# Patient Record
Sex: Female | Born: 1960
Health system: Southern US, Community
[De-identification: ages and names within clinical notes are randomized; demographics above are authoritative.]

## PROBLEM LIST (undated history)

## (undated) DIAGNOSIS — K219 Gastro-esophageal reflux disease without esophagitis: Secondary | ICD-10-CM

## (undated) DIAGNOSIS — F32A Depression, unspecified: Secondary | ICD-10-CM

## (undated) DIAGNOSIS — Z923 Personal history of irradiation: Secondary | ICD-10-CM

## (undated) DIAGNOSIS — F419 Anxiety disorder, unspecified: Secondary | ICD-10-CM

## (undated) DIAGNOSIS — B019 Varicella without complication: Secondary | ICD-10-CM

## (undated) HISTORY — DX: Depression, unspecified: F32.A

## (undated) HISTORY — PX: BREAST LUMPECTOMY: SHX2

## (undated) HISTORY — PX: EYE SURGERY: SHX253

## (undated) HISTORY — DX: Anxiety disorder, unspecified: F41.9

## (undated) HISTORY — DX: Varicella without complication: B01.9

---

## 1998-02-24 HISTORY — PX: TUBAL LIGATION: SHX77

## 2004-08-01 ENCOUNTER — Ambulatory Visit: Payer: Self-pay

## 2004-10-18 ENCOUNTER — Ambulatory Visit: Payer: Self-pay | Admitting: Unknown Physician Specialty

## 2005-05-05 ENCOUNTER — Inpatient Hospital Stay (HOSPITAL_COMMUNITY): Admission: RE | Admit: 2005-05-05 | Discharge: 2005-05-09 | Payer: Self-pay | Admitting: Psychiatry

## 2005-05-06 ENCOUNTER — Ambulatory Visit: Payer: Self-pay | Admitting: Psychiatry

## 2005-09-08 ENCOUNTER — Ambulatory Visit: Payer: Self-pay | Admitting: Unknown Physician Specialty

## 2006-09-10 ENCOUNTER — Ambulatory Visit: Payer: Self-pay

## 2008-02-25 HISTORY — PX: BREAST BIOPSY: SHX20

## 2008-08-23 ENCOUNTER — Ambulatory Visit: Payer: Self-pay | Admitting: Unknown Physician Specialty

## 2008-08-24 ENCOUNTER — Ambulatory Visit: Payer: Self-pay | Admitting: Unknown Physician Specialty

## 2009-03-28 ENCOUNTER — Ambulatory Visit: Payer: Self-pay | Admitting: General Surgery

## 2009-11-01 ENCOUNTER — Ambulatory Visit: Payer: Self-pay

## 2009-12-28 ENCOUNTER — Ambulatory Visit: Payer: Self-pay | Admitting: Unknown Physician Specialty

## 2010-01-31 ENCOUNTER — Other Ambulatory Visit: Payer: Self-pay | Admitting: Internal Medicine

## 2011-05-21 ENCOUNTER — Ambulatory Visit: Payer: Self-pay | Admitting: General Practice

## 2012-10-06 ENCOUNTER — Ambulatory Visit: Payer: Self-pay

## 2012-10-15 ENCOUNTER — Ambulatory Visit: Payer: Self-pay

## 2013-01-04 LAB — HM COLONOSCOPY

## 2013-12-21 ENCOUNTER — Ambulatory Visit: Payer: Self-pay

## 2014-05-24 ENCOUNTER — Encounter: Payer: Self-pay | Admitting: *Deleted

## 2014-12-11 ENCOUNTER — Encounter: Payer: Self-pay | Admitting: Physician Assistant

## 2014-12-11 ENCOUNTER — Ambulatory Visit: Payer: Self-pay | Admitting: Physician Assistant

## 2014-12-11 VITALS — BP 118/80 | Temp 98.4°F | Wt 145.0 lb

## 2014-12-11 DIAGNOSIS — H1089 Other conjunctivitis: Secondary | ICD-10-CM

## 2014-12-11 MED ORDER — GENTAMICIN SULFATE 0.3 % OP SOLN: 1.0000 [drp] | Freq: Four times a day (QID) | OPHTHALMIC | Status: DC

## 2014-12-11 MED ORDER — OLOPATADINE HCL 0.1 % OP SOLN: 1.0000 [drp] | Freq: Two times a day (BID) | OPHTHALMIC | Status: DC

## 2014-12-11 NOTE — Progress Notes (Signed)
Eye drainage  Subjective:    Patient ID: Heather Jensen, female    DOB: [birth year], 54 y.o.   MRN: [id number]  HPI Patient complain of 3 week of clear drainage from eye. Two days ago noticed matted eyelid upon awakening.  Denies any vision loss. Took one prednisone pill, strength unkown, and had transit relief.   Review of Systems  Negative except for compliant.    Objective:   Physical Exam PERRL/EOM intact. Erythematous conjunctiva. Dried secretion left eyelid.       Assessment & Plan:  Conjunctivitis. Gentex and Patanol eye drops as driected.  Follow up one week.

## 2015-07-24 ENCOUNTER — Encounter: Payer: Self-pay | Admitting: Physician Assistant

## 2015-07-24 ENCOUNTER — Ambulatory Visit: Payer: Self-pay | Admitting: Physician Assistant

## 2015-07-24 VITALS — BP 124/90 | HR 60 | Temp 98.1°F

## 2015-07-24 DIAGNOSIS — H1033 Unspecified acute conjunctivitis, bilateral: Secondary | ICD-10-CM

## 2015-07-24 MED ORDER — ERYTHROMYCIN 5 MG/GM OP OINT
1.0000 | TOPICAL_OINTMENT | Freq: Three times a day (TID) | OPHTHALMIC | Status: DC
Start: 2015-07-24 — End: 2015-10-17

## 2015-07-24 MED ORDER — TOBRAMYCIN-DEXAMETHASONE 0.3-0.05 % OP SUSP: 2.0000 [drp] | Freq: Four times a day (QID) | OPHTHALMIC | Status: DC

## 2015-07-24 NOTE — Progress Notes (Signed)
S:  C/o both eyes being irritated, matted, sx started 2 d ago, denies, cough, congestion, fever, chills, v/d; remainder ros neg, used old container of great lash mascara, states the last time she had this she had to have drops with steroid and an ointment for her lashes  O: vitals wnl, nad, perrl eomi, both eyes with injected conjunctiva, no drainage or matting noted at this time, neuro intact  A:  Acute conjunctivitis  P: tobradex opth gtts, return if not better in 3 - 5d, if worsening return earlier or see eye doctor, no work until eye is no longer red or draining

## 2015-08-06 DIAGNOSIS — H1089 Other conjunctivitis: Secondary | ICD-10-CM | POA: Diagnosis not present

## 2015-08-10 DIAGNOSIS — H04123 Dry eye syndrome of bilateral lacrimal glands: Secondary | ICD-10-CM | POA: Diagnosis not present

## 2015-08-17 DIAGNOSIS — H04123 Dry eye syndrome of bilateral lacrimal glands: Secondary | ICD-10-CM | POA: Diagnosis not present

## 2015-08-24 DIAGNOSIS — H04123 Dry eye syndrome of bilateral lacrimal glands: Secondary | ICD-10-CM | POA: Diagnosis not present

## 2015-08-29 DIAGNOSIS — H16223 Keratoconjunctivitis sicca, not specified as Sjogren's, bilateral: Secondary | ICD-10-CM | POA: Diagnosis not present

## 2015-09-12 DIAGNOSIS — H16223 Keratoconjunctivitis sicca, not specified as Sjogren's, bilateral: Secondary | ICD-10-CM | POA: Diagnosis not present

## 2015-09-26 ENCOUNTER — Ambulatory Visit: Payer: Self-pay | Admitting: Family Medicine

## 2015-10-04 DIAGNOSIS — H04123 Dry eye syndrome of bilateral lacrimal glands: Secondary | ICD-10-CM | POA: Diagnosis not present

## 2015-10-17 ENCOUNTER — Other Ambulatory Visit (HOSPITAL_COMMUNITY)
Admission: RE | Admit: 2015-10-17 | Discharge: 2015-10-17 | Disposition: A | Discharge status: Home / Self Care | Payer: 59 | Source: Ambulatory Visit | Attending: Internal Medicine | Admitting: Internal Medicine

## 2015-10-17 ENCOUNTER — Encounter: Payer: Self-pay | Admitting: Family Medicine

## 2015-10-17 ENCOUNTER — Ambulatory Visit (INDEPENDENT_AMBULATORY_CARE_PROVIDER_SITE_OTHER): Payer: 59 | Admitting: Family Medicine

## 2015-10-17 VITALS — BP 132/79 | HR 72 | Temp 98.4°F | Ht 66.0 in | Wt 156.0 lb

## 2015-10-17 DIAGNOSIS — Z0000 Encounter for general adult medical examination without abnormal findings: Secondary | ICD-10-CM | POA: Diagnosis not present

## 2015-10-17 DIAGNOSIS — Z01419 Encounter for gynecological examination (general) (routine) without abnormal findings: Secondary | ICD-10-CM | POA: Diagnosis not present

## 2015-10-17 DIAGNOSIS — Z1239 Encounter for other screening for malignant neoplasm of breast: Secondary | ICD-10-CM | POA: Diagnosis not present

## 2015-10-17 DIAGNOSIS — Z124 Encounter for screening for malignant neoplasm of cervix: Secondary | ICD-10-CM | POA: Diagnosis not present

## 2015-10-17 LAB — COMPREHENSIVE METABOLIC PANEL
ALK PHOS: 38 U/L — AB (ref 39–117)
ALT: 19 U/L (ref 0–35)
AST: 22 U/L (ref 0–37)
Albumin: 4.3 g/dL (ref 3.5–5.2)
BILIRUBIN TOTAL: 0.6 mg/dL (ref 0.2–1.2)
BUN: 9 mg/dL (ref 6–23)
CALCIUM: 9.2 mg/dL (ref 8.4–10.5)
CO2: 32 meq/L (ref 19–32)
CREATININE: 0.69 mg/dL (ref 0.40–1.20)
Chloride: 103 mEq/L (ref 96–112)
GFR: 94.07 mL/min (ref >60.00)
GLUCOSE: 88 mg/dL (ref 70–99)
Potassium: 3.7 mEq/L (ref 3.5–5.1)
Sodium: 140 mEq/L (ref 135–145)
TOTAL PROTEIN: 7.1 g/dL (ref 6.0–8.3)

## 2015-10-17 LAB — LIPID PANEL
CHOL/HDL RATIO: 2
Cholesterol: 168 mg/dL (ref 0–200)
HDL: 72.1 mg/dL (ref >39.00)
LDL Cholesterol: 75 mg/dL (ref 0–99)
NONHDL: 95.75
Triglycerides: 104 mg/dL (ref 0.0–149.0)
VLDL: 20.8 mg/dL (ref 0.0–40.0)

## 2015-10-17 LAB — CBC
HEMATOCRIT: 40.8 % (ref 36.0–46.0)
HEMOGLOBIN: 13.8 g/dL (ref 12.0–15.0)
MCHC: 33.7 g/dL (ref 30.0–36.0)
MCV: 90.8 fl (ref 78.0–100.0)
Platelets: 254 10*3/uL (ref 150.0–400.0)
RBC: 4.5 Mil/uL (ref 3.87–5.11)
RDW: 13.1 % (ref 11.5–15.5)
WBC: 6.8 10*3/uL (ref 4.0–10.5)

## 2015-10-17 LAB — TSH: TSH: 0.62 u[IU]/mL (ref 0.35–4.50)

## 2015-10-17 LAB — HEMOGLOBIN A1C: Hgb A1c MFr Bld: 5.3 % (ref 4.6–6.5)

## 2015-10-17 NOTE — Patient Instructions (Signed)
Health Maintenance, Female Adopting a healthy lifestyle and getting preventive care can go a long way to promote health and wellness. Talk with your health care provider about what schedule of regular examinations is right for you. This is a good chance for you to check in with your provider about disease prevention and staying healthy. In between checkups, there are plenty of things you can do on your own. Experts have done a lot of research about which lifestyle changes and preventive measures are most likely to keep you healthy. Ask your health care provider for more information. WEIGHT AND DIET  Eat a healthy diet  Be sure to include plenty of vegetables, fruits, low-fat dairy products, and lean protein.  Do not eat a lot of foods high in solid fats, added sugars, or salt.  Get regular exercise. This is one of the most important things you can do for your health.  Most adults should exercise for at least 150 minutes each week. The exercise should increase your heart rate and make you sweat (moderate-intensity exercise).  Most adults should also do strengthening exercises at least twice a week. This is in addition to the moderate-intensity exercise.  Maintain a healthy weight  Body mass index (BMI) is a measurement that can be used to identify possible weight problems. It estimates body fat based on height and weight. Your health care provider can help determine your BMI and help you achieve or maintain a healthy weight.  For females 20 years of age and older:   A BMI below 18.5 is considered underweight.  A BMI of 18.5 to 24.9 is normal.  A BMI of 25 to 29.9 is considered overweight.  A BMI of 30 and above is considered obese.  Watch levels of cholesterol and blood lipids  You should start having your blood tested for lipids and cholesterol at 54 years of age, then have this test every 5 years.  You may need to have your cholesterol levels checked more often if:  Your lipid  or cholesterol levels are high.  You are older than 54 years of age.  You are at high risk for heart disease.  CANCER SCREENING   Lung Cancer  Lung cancer screening is recommended for adults 55-80 years old who are at high risk for lung cancer because of a history of smoking.  A yearly low-dose CT scan of the lungs is recommended for people who:  Currently smoke.  Have quit within the past 15 years.  Have at least a 30-pack-year history of smoking. A pack year is smoking an average of one pack of cigarettes a day for 1 year.  Yearly screening should continue until it has been 15 years since you quit.  Yearly screening should stop if you develop a health problem that would prevent you from having lung cancer treatment.  Breast Cancer  Practice breast self-awareness. This means understanding how your breasts normally appear and feel.  It also means doing regular breast self-exams. Let your health care provider know about any changes, no matter how small.  If you are in your 20s or 30s, you should have a clinical breast exam (CBE) by a health care provider every 1-3 years as part of a regular health exam.  If you are 40 or older, have a CBE every year. Also consider having a breast X-ray (mammogram) every year.  If you have a family history of breast cancer, talk to your health care provider about genetic screening.  If you   are at high risk for breast cancer, talk to your health care provider about having an MRI and a mammogram every year.  Breast cancer gene (BRCA) assessment is recommended for women who have family members with BRCA-related cancers. BRCA-related cancers include:  Breast.  Ovarian.  Tubal.  Peritoneal cancers.  Results of the assessment will determine the need for genetic counseling and BRCA1 and BRCA2 testing. Cervical Cancer Your health care provider may recommend that you be screened regularly for cancer of the pelvic organs (ovaries, uterus, and  vagina). This screening involves a pelvic examination, including checking for microscopic changes to the surface of your cervix (Pap test). You may be encouraged to have this screening done every 3 years, beginning at age 21.  For women ages 30-65, health care providers may recommend pelvic exams and Pap testing every 3 years, or they may recommend the Pap and pelvic exam, combined with testing for human papilloma virus (HPV), every 5 years. Some types of HPV increase your risk of cervical cancer. Testing for HPV may also be done on women of any age with unclear Pap test results.  Other health care providers may not recommend any screening for nonpregnant women who are considered low risk for pelvic cancer and who do not have symptoms. Ask your health care provider if a screening pelvic exam is right for you.  If you have had past treatment for cervical cancer or a condition that could lead to cancer, you need Pap tests and screening for cancer for at least 20 years after your treatment. If Pap tests have been discontinued, your risk factors (such as having a new sexual partner) need to be reassessed to determine if screening should resume. Some women have medical problems that increase the chance of getting cervical cancer. In these cases, your health care provider may recommend more frequent screening and Pap tests. Colorectal Cancer  This type of cancer can be detected and often prevented.  Routine colorectal cancer screening usually begins at 54 years of age and continues through 54 years of age.  Your health care provider may recommend screening at an earlier age if you have risk factors for colon cancer.  Your health care provider may also recommend using home test kits to check for hidden blood in the stool.  A small camera at the end of a tube can be used to examine your colon directly (sigmoidoscopy or colonoscopy). This is done to check for the earliest forms of colorectal  cancer.  Routine screening usually begins at age 50.  Direct examination of the colon should be repeated every 5-10 years through 54 years of age. However, you may need to be screened more often if early forms of precancerous polyps or small growths are found. Skin Cancer  Check your skin from head to toe regularly.  Tell your health care provider about any new moles or changes in moles, especially if there is a change in a mole's shape or color.  Also tell your health care provider if you have a mole that is larger than the size of a pencil eraser.  Always use sunscreen. Apply sunscreen liberally and repeatedly throughout the day.  Protect yourself by wearing long sleeves, pants, a wide-brimmed hat, and sunglasses whenever you are outside. HEART DISEASE, DIABETES, AND HIGH BLOOD PRESSURE   High blood pressure causes heart disease and increases the risk of stroke. High blood pressure is more likely to develop in:  People who have blood pressure in the high end   of the normal range (130-139/85-89 mm Hg).  People who are overweight or obese.  People who are African American.  If you are 38-23 years of age, have your blood pressure checked every 3-5 years. If you are 61 years of age or older, have your blood pressure checked every year. You should have your blood pressure measured twice--once when you are at a hospital or clinic, and once when you are not at a hospital or clinic. Record the average of the two measurements. To check your blood pressure when you are not at a hospital or clinic, you can use:  An automated blood pressure machine at a pharmacy.  A home blood pressure monitor.  If you are between 45 years and 39 years old, ask your health care provider if you should take aspirin to prevent strokes.  Have regular diabetes screenings. This involves taking a blood sample to check your fasting blood sugar level.  If you are at a normal weight and have a low risk for diabetes,  have this test once every three years after 54 years of age.  If you are overweight and have a high risk for diabetes, consider being tested at a younger age or more often. PREVENTING INFECTION  Hepatitis B  If you have a higher risk for hepatitis B, you should be screened for this virus. You are considered at high risk for hepatitis B if:  You were born in a country where hepatitis B is common. Ask your health care provider which countries are considered high risk.  Your parents were born in a high-risk country, and you have not been immunized against hepatitis B (hepatitis B vaccine).  You have HIV or AIDS.  You use needles to inject street drugs.  You live with someone who has hepatitis B.  You have had sex with someone who has hepatitis B.  You get hemodialysis treatment.  You take certain medicines for conditions, including cancer, organ transplantation, and autoimmune conditions. Hepatitis C  Blood testing is recommended for:  Everyone born from [birth year] through 1964.  Anyone with known risk factors for hepatitis C. Sexually transmitted infections (STIs)  You should be screened for sexually transmitted infections (STIs) including gonorrhea and chlamydia if:  You are sexually active and are younger than 54 years of age.  You are older than 53 years of age and your health care provider tells you that you are at risk for this type of infection.  Your sexual activity has changed since you were last screened and you are at an increased risk for chlamydia or gonorrhea. Ask your health care provider if you are at risk.  If you do not have HIV, but are at risk, it may be recommended that you take a prescription medicine daily to prevent HIV infection. This is called pre-exposure prophylaxis (PrEP). You are considered at risk if:  You are sexually active and do not regularly use condoms or know the HIV status of your partner(s).  You take drugs by injection.  You are sexually  active with a partner who has HIV. Talk with your health care provider about whether you are at high risk of being infected with HIV. If you choose to begin PrEP, you should first be tested for HIV. You should then be tested every 3 months for as long as you are taking PrEP.  PREGNANCY   If you are premenopausal and you may become pregnant, ask your health care provider about preconception counseling.  If you may  become pregnant, take 400 to 800 micrograms (mcg) of folic acid every day.  If you want to prevent pregnancy, talk to your health care provider about birth control (contraception). OSTEOPOROSIS AND MENOPAUSE   Osteoporosis is a disease in which the bones lose minerals and strength with aging. This can result in serious bone fractures. Your risk for osteoporosis can be identified using a bone density scan.  If you are 61 years of age or older, or if you are at risk for osteoporosis and fractures, ask your health care provider if you should be screened.  Ask your health care provider whether you should take a calcium or vitamin D supplement to lower your risk for osteoporosis.  Menopause may have certain physical symptoms and risks.  Hormone replacement therapy may reduce some of these symptoms and risks. Talk to your health care provider about whether hormone replacement therapy is right for you.  HOME CARE INSTRUCTIONS   Schedule regular health, dental, and eye exams.  Stay current with your immunizations.   Do not use any tobacco products including cigarettes, chewing tobacco, or electronic cigarettes.  If you are pregnant, do not drink alcohol.  If you are breastfeeding, limit how much and how often you drink alcohol.  Limit alcohol intake to no more than 1 drink per day for nonpregnant women. One drink equals 12 ounces of beer, 5 ounces of wine, or 1 ounces of hard liquor.  Do not use street drugs.  Do not share needles.  Ask your health care provider for help if  you need support or information about quitting drugs.  Tell your health care provider if you often feel depressed.  Tell your health care provider if you have ever been abused or do not feel safe at home.   This information is not intended to replace advice given to you by your health care provider. Make sure you discuss any questions you have with your health care provider.   Document Released: 08/26/2010 Document Revised: 03/03/2014 Document Reviewed: 01/12/2013 Elsevier Interactive Patient Education [employer].

## 2015-10-17 NOTE — Assessment & Plan Note (Signed)
Pap smear performed today.  Mammogram ordered and scheduled. Colonoscopy up to date.  Tetanus up to date. Flu at work. Breast exam normal.

## 2015-10-17 NOTE — Progress Notes (Signed)
Subjective:  Patient ID: Heather Jensen, female    DOB: 1960-12-17  Age: 55 y.o. MRN: [id number]  CC: Establish care/Annual exam.   HPI Heather Jensen is a 55 y.o. female presents to the clinic today to establish care/Annual exam.  Preventative Healthcare  Pap smear: In need of. Will perform today.  Mammogram: In need of. Ordered today.  Colonoscopy: Up to date.   Immunizations  Tetanus - Up to date.  Pneumococcal - N/A.  Flu - Will receive at work.  Zoster - N/A per guidelines.  Labs: Screening labs today.  Exercise: No regular exercise.  Alcohol use: No.  Smoking/tobacco use: Former smoker.  STD/HIV testing: Declined.  Regular dental exams: Yes.   Wears seat belt: Yes.   PMH, Surgical Hx, Family Hx, Social History reviewed and updated as below.  Past Medical History:  Diagnosis Date  . Chicken pox    Past Surgical History:  Procedure Laterality Date  . BREAST BIOPSY  2010   biopsy of a lump  . CESAREAN SECTION  1989   Family History  Problem Relation Age of Onset  . Hyperlipidemia Mother   . Hypertension Father   . Lung cancer Paternal Grandfather    Social History  Substance Use Topics  . Smoking status: Former Research scientist (life sciences)  . Smokeless tobacco: Never Used  . Alcohol use No   Review of Systems General: Denies unexplained weight loss, fever. Skin: Denies new or changing mole, sore/wound that won't heal. ENT: Trouble hearing, ringing in the ears, sores in the mouth, hoarseness, trouble swallowing. Eyes: Denies trouble seeing/visual disturbance. Heart/CV: Denies chest pain, shortness of breath, edema, palpitations. Lungs/Resp: Denies cough, shortness of breath, hemoptysis. Abd/GI: Denies nausea, vomiting, diarrhea, constipation, abdominal pain, hematochezia, melena. GU: Denies dysuria, incontinence, hematuria, urinary frequency, difficulty starting/keeping stream, vaginal discharge, sexual difficulty, lump in breasts. MSK: Denies  joint pain/swelling, myalgias. Neuro: Denies headaches, weakness, numbness, dizziness, syncope. Psych: Denies sadness, anxiety, stress, memory difficulty. Endocrine: Denies polyuria and polydipsia.  Objective:   Today's Vitals: BP 132/79 (BP Location: Left Arm, Patient Position: Sitting, Cuff Size: Normal)   Pulse 72   Temp 98.4 F (36.9 C) (Oral)   Ht 5' 6" (1.676 m)   Wt 156 lb (70.8 kg)   SpO2 100%   BMI 25.18 kg/m   Physical Exam  Constitutional: She is oriented to person, place, and time. She appears well-developed and well-nourished. No distress.  HENT:  Head: Normocephalic and atraumatic.  Nose: Nose normal.  Mouth/Throat: Oropharynx is clear and moist. No oropharyngeal exudate.  Normal TM's bilaterally.   Eyes: Conjunctivae are normal. No scleral icterus.  Neck: Neck supple. No thyromegaly present.  Cardiovascular: Normal rate and regular rhythm.   No murmur heard. Pulmonary/Chest: Effort normal and breath sounds normal. She has no wheezes. She has no rales.  Breasts: breasts appear normal, no appreciable masses, no skin or nipple changes or axillary nodes.   Abdominal: Soft. She exhibits no distension. There is no tenderness. There is no rebound and no guarding.  Genitourinary:  Genitourinary Comments: Pelvic Exam: External: normal female genitalia without lesions or masses Vagina: normal without lesions or masses Cervix: normal without lesions or masses. Pap smear: performed.   Musculoskeletal: Normal range of motion. She exhibits no edema.  Lymphadenopathy:    She has no cervical adenopathy.  Neurological: She is alert and oriented to person, place, and time.  Skin: Skin is warm and dry. No rash noted.  Psychiatric: She has a normal mood and  affect.  Vitals reviewed.  Assessment & Plan:   Problem List Items Addressed This Visit    Well woman exam with routine gynecological exam - Primary    Pap smear performed today.  Mammogram ordered and  scheduled. Colonoscopy up to date.  Tetanus up to date. Flu at work. Breast exam normal.       Relevant Orders   MM DIGITAL SCREENING BILATERAL   CBC   Comp Met (CMET)   Lipid Profile   HgB A1c   TSH   Cytology - PAP    Other Visit Diagnoses    Screening for breast cancer       Pap smear for cervical cancer screening         Outpatient Encounter Prescriptions as of 10/17/2015  Medication Sig  . [DISCONTINUED] cetirizine (ZYRTEC) 10 MG tablet Take 10 mg by mouth daily. Reported on 07/24/2015  . [DISCONTINUED] erythromycin ophthalmic ointment Place 1 application into both eyes 3 (three) times daily.  . [DISCONTINUED] gentamicin (GARAMYCIN) 0.3 % ophthalmic solution Place 1 drop into both eyes 4 (four) times daily. (Patient not taking: Reported on 07/24/2015)  . [DISCONTINUED] olopatadine (PATANOL) 0.1 % ophthalmic solution Place 1 drop into the left eye 2 (two) times daily. (Patient not taking: Reported on 07/24/2015)  . [DISCONTINUED] Tobramycin-Dexamethasone 0.3-0.05 % SUSP Apply 2 drops to eye QID.   No facility-administered encounter medications on file as of 10/17/2015.    Follow-up: Annually.   [place]

## 2015-10-18 LAB — CYTOLOGY - PAP

## 2015-10-19 ENCOUNTER — Telehealth: Payer: Self-pay

## 2015-10-19 NOTE — Telephone Encounter (Signed)
-----   Message from Dia Crawford, LPN sent at [id number] 10:47 AM EDT -----   ----- Message ----- From: Coral Spikes, DO Sent: 10/19/2015   7:23 AM To: Carmin Muskrat, CMA  Normal pap.

## 2015-10-19 NOTE — Telephone Encounter (Signed)
Sent mychart message of normal pap results.

## 2015-10-19 NOTE — Telephone Encounter (Signed)
LMTCB

## 2015-10-22 ENCOUNTER — Encounter: Payer: Self-pay | Admitting: Family Medicine

## 2015-10-24 ENCOUNTER — Other Ambulatory Visit: Payer: Self-pay | Admitting: Family Medicine

## 2015-10-24 ENCOUNTER — Ambulatory Visit
Admission: RE | Admit: 2015-10-24 | Discharge: 2015-10-24 | Disposition: A | Discharge status: Home / Self Care | Payer: 59 | Source: Ambulatory Visit | Attending: Family Medicine | Admitting: Family Medicine

## 2015-10-24 DIAGNOSIS — Z01419 Encounter for gynecological examination (general) (routine) without abnormal findings: Secondary | ICD-10-CM

## 2015-10-24 DIAGNOSIS — Z1231 Encounter for screening mammogram for malignant neoplasm of breast: Secondary | ICD-10-CM | POA: Insufficient documentation

## 2015-12-21 ENCOUNTER — Ambulatory Visit (INDEPENDENT_AMBULATORY_CARE_PROVIDER_SITE_OTHER): Payer: 59 | Admitting: Family Medicine

## 2015-12-21 ENCOUNTER — Encounter: Payer: Self-pay | Admitting: Family Medicine

## 2015-12-21 DIAGNOSIS — R21 Rash and other nonspecific skin eruption: Secondary | ICD-10-CM | POA: Diagnosis not present

## 2015-12-21 MED ORDER — METRONIDAZOLE 1 % EX CREA: TOPICAL_CREAM | Freq: Every day | CUTANEOUS | 0 refills | Status: DC

## 2015-12-21 NOTE — Addendum Note (Signed)
Addended by: Coral Spikes on: 12/21/2015 09:00 AM   Modules accepted: Orders

## 2015-12-21 NOTE — Addendum Note (Signed)
Addended by: Coral Spikes on: 12/21/2015 09:01 AM   Modules accepted: Orders

## 2015-12-21 NOTE — Progress Notes (Addendum)
   Subjective:  Patient ID: Heather Jensen, female    DOB: Mar 13, 1960  Age: 55 y.o. MRN: [id number]  CC: Rash  HPI:  55 year old female presents with complaints of rash.  Rash  X 2 weeks.  Located primarily around the chin/lower jaw line.  Does affect the cheeks as well.  She reports dryness and irritation. She's been using Vaseline with some improvement.  She states that it is intermittent in comes and goes.  She reports that she has an ongoing issue with dry eyes as well.  She is concerned that this may be related.  No new changes or exposures.  No known exacerbating factors. No other complaints at this time.  Social Hx   Social History   Social History  . Marital status: Married    Spouse name: N/A  . Number of children: N/A  . Years of education: N/A   Social History Main Topics  . Smoking status: Former Research scientist (life sciences)  . Smokeless tobacco: Never Used  . Alcohol use No  . Drug use: Unknown  . Sexual activity: Yes    Partners: Male   Other Topics Concern  . None   Social History Narrative  . None   Review of Systems  Constitutional: Negative.   Skin: Positive for rash.   Objective:  BP 130/82 (BP Location: Right Arm, Patient Position: Sitting, Cuff Size: Normal)   Pulse 65   Temp 98.1 F (36.7 C) (Oral)   Wt 164 lb 2 oz (74.4 kg)   SpO2 94%   BMI 26.49 kg/m   BP/Weight 12/21/2015 10/17/2015 [id number]  Systolic BP [id number]  Diastolic BP 82 79 90  Wt. (Lbs) 164.13 156 -  BMI 26.49 25.18 -   Physical Exam  Constitutional: She is oriented to person, place, and time. She appears well-developed. No distress.  Pulmonary/Chest: Effort normal.  Neurological: She is alert and oriented to person, place, and time.  Skin:  Face - mild erythema and dryness noted around the chin. Mild swelling.  Psychiatric: She has a normal mood and affect.  Vitals reviewed.  Lab Results  Component Value Date   WBC 6.8 10/17/2015   HGB 13.8 10/17/2015   HCT  40.8 10/17/2015   PLT 254.0 10/17/2015   GLUCOSE 88 10/17/2015   CHOL 168 10/17/2015   TRIG 104.0 10/17/2015   HDL 72.10 10/17/2015   LDLCALC 75 10/17/2015   ALT 19 10/17/2015   AST 22 10/17/2015   NA 140 10/17/2015   K 3.7 10/17/2015   CL 103 10/17/2015   CREATININE 0.69 10/17/2015   BUN 9 10/17/2015   CO2 32 10/17/2015   TSH 0.62 10/17/2015   HGBA1C 5.3 10/17/2015    Assessment & Plan:   Problem List Items Addressed This Visit    Rash    New problem. Unclear etiology/prognosis. DDx - dry skin, rosacea, perioral dermatitis. Trial of Metronidazole.      Relevant Orders   Ambulatory referral to Dermatology    Other Visit Diagnoses   None.    Meds ordered this encounter  Medications  . metronidazole (NORITATE) 1 % cream    Sig: Apply topically daily.    Dispense:  60 g    Refill:  0   Follow-up: PRN  [place]

## 2015-12-21 NOTE — Progress Notes (Signed)
Pre visit review using our clinic review tool, if applicable. No additional management support is needed unless otherwise documented below in the visit note. 

## 2015-12-21 NOTE — Patient Instructions (Signed)
Try Cerave.  Start the topical Metronidazole.  We will arrange the referral.  Take care  Dr. Lacinda Axon

## 2015-12-21 NOTE — Assessment & Plan Note (Signed)
New problem. Unclear etiology/prognosis. DDx - dry skin, rosacea, perioral dermatitis. Trial of Metronidazole.

## 2016-01-02 DIAGNOSIS — H01001 Unspecified blepharitis right upper eyelid: Secondary | ICD-10-CM | POA: Diagnosis not present

## 2016-02-05 DIAGNOSIS — H01001 Unspecified blepharitis right upper eyelid: Secondary | ICD-10-CM | POA: Diagnosis not present

## 2016-03-18 DIAGNOSIS — H16223 Keratoconjunctivitis sicca, not specified as Sjogren's, bilateral: Secondary | ICD-10-CM | POA: Diagnosis not present

## 2017-01-28 DIAGNOSIS — H524 Presbyopia: Secondary | ICD-10-CM | POA: Diagnosis not present

## 2017-02-24 DIAGNOSIS — D369 Benign neoplasm, unspecified site: Secondary | ICD-10-CM

## 2017-02-24 HISTORY — PX: COLONOSCOPY: SHX174

## 2017-02-24 HISTORY — DX: Benign neoplasm, unspecified site: D36.9

## 2017-04-10 ENCOUNTER — Encounter: Payer: Self-pay | Admitting: Nurse Practitioner

## 2017-04-10 ENCOUNTER — Ambulatory Visit (INDEPENDENT_AMBULATORY_CARE_PROVIDER_SITE_OTHER): Payer: 59 | Admitting: Nurse Practitioner

## 2017-04-10 ENCOUNTER — Other Ambulatory Visit: Payer: Self-pay

## 2017-04-10 VITALS — BP 136/73 | HR 60 | Temp 98.5°F | Ht 66.0 in | Wt 176.6 lb

## 2017-04-10 DIAGNOSIS — E663 Overweight: Secondary | ICD-10-CM | POA: Diagnosis not present

## 2017-04-10 DIAGNOSIS — Z7689 Persons encountering health services in other specified circumstances: Secondary | ICD-10-CM | POA: Diagnosis not present

## 2017-04-10 DIAGNOSIS — K5901 Slow transit constipation: Secondary | ICD-10-CM

## 2017-04-10 NOTE — Assessment & Plan Note (Signed)
Pt with goal of BMI = 25.  She is already 13 lbs toward goal since January 2.  Using Weight [employer] and has reached a plateau.  Not always consuming recommended point value daily.  Plan: 1. Continue Weight Watchers and consume recommended point value.  Consider adding some moderate glycemic fruits and vegetables. 2. Can then reduce point value again to prompt continued weight loss. 3. Followup as needed and in 3 months with annual physical.

## 2017-04-10 NOTE — Assessment & Plan Note (Addendum)
Pt reports regular pattern of constipation.  She notes occasional length of time between BM of 5 days with [facility] stool Type 1-2.  Normally [place] stool type 3-4, but most often type 3.  Increased fiber and regular 64 oz water has not helped.  Plan: 1. Encouraged pt to increase water to 72-84 oz water daily. 2. May use colace twice daily if no BM in 3 days.  Add dulcolax x 1 tablet if no BM in 5 days. 3. Can consider linzess or amitiza in future if needed.  4. Followup 3 months for annual physical and as needed.

## 2017-04-10 NOTE — Progress Notes (Signed)
Subjective:    Patient ID: Heather Jensen, female    DOB: 1960-07-08, 57 y.o.   MRN: [id number]  Heather Jensen is a 57 y.o. female presenting on 04/10/2017 for Establish Care (transferring care from [facility] DR. Lacinda Axon )   HPI [place] Provider Pt last seen by PCP Dr. Lacinda Axon about 2 years ago.  Obtain records from [facility].   Last PAP: 2017, Mammogram: 2017, Colonoscopy 2014 (Dr. Vira Agar) polyps?5 year follow-up. - Pt states she will call for followup interval.  Overweight - active Weight Watcher's plan for loss. Weightloss since January of 13 lbs.  Pt has goal of total loss of 25 lbs.   - Pt is almost always under 23 points daily w/ WW Tends to follow low dairy, lots of lean meat/green vegetables.  Rarely bread/refined carbs. -- Breakfast: yogurt, egg -- Lunch: lean meat, salad/green veg,  -- Dinner: lean meat, salad/geen vegs.  Red meat one x per week.  Constipation Pt reports she is regularly constipated.  Has family history of slow-transit constipation.  Usually will have at least 3 days between [facility], but will occasionally be 5 days.  At that point, BM is very hard and requires straining to defecate, pt is significantly bloated. - Denies abdominal pain, nausea, vomiting, diarrhea, hemorrhoids, bloody stools, ribbon-like stools/other shape change. - Has tried Miralax once daily, then it became ineffective after several months-years of use.    - Has also used colace without significant change in bowel habits. - Notes has increased fiber in diet and expected that to help. - Drinking at least 64 oz daily.  X 6 weeks.   Past Medical History:  Diagnosis Date  . Chicken pox    Past Surgical History:  Procedure Laterality Date  . BREAST BIOPSY Left 2010   biopsy of a lump  . CESAREAN SECTION  1989  . TUBAL LIGATION  2000   Social History   Socioeconomic History  . Marital status: Married    Spouse name: Not on file  . Number of children: Not on file   . Years of education: Not on file  . Highest education level: Bachelor's degree (e.g., BA, AB, BS)  Social Needs  . Financial resource strain: Not hard at all  . Food insecurity - worry: Never true  . Food insecurity - inability: Never true  . Transportation needs - medical: No  . Transportation needs - non-medical: No  Occupational History  . Not on file  Tobacco Use  . Smoking status: Former Smoker    Packs/day: 0.50    Years: 30.00    Pack years: 15.00    Last attempt to quit: 04/10/2014    Years since quitting: 3.0  . Smokeless tobacco: Never Used  Substance and Sexual Activity  . Alcohol use: No    Alcohol/week: 0.0 oz  . Drug use: No  . Sexual activity: Yes    Partners: Male    Birth control/protection: None  Other Topics Concern  . Not on file  Social History Narrative  . Not on file   Family History  Problem Relation Age of Onset  . Hyperlipidemia Mother   . Alzheimer's disease Mother   . Hypertension Father   . Heart disease Father   . Lung cancer Paternal Grandfather   . Melanoma Brother   . Breast cancer Neg Hx   . Stroke Neg Hx    No current outpatient medications on file prior to visit.   No current facility-administered  medications on file prior to visit.     Review of Systems  Constitutional: Negative.   HENT: Negative.   Eyes: Negative.   Respiratory: Negative.   Cardiovascular: Negative.   Gastrointestinal: Positive for constipation.  Endocrine: Negative.   Genitourinary: Negative.   Musculoskeletal: Negative.   Skin: Negative.   Allergic/Immunologic: Negative.   Neurological: Negative.   Hematological: Negative.   Psychiatric/Behavioral: Negative.    Per HPI unless specifically indicated above      Objective:    BP 136/73 (BP Location: Right Arm, Patient Position: Sitting, Cuff Size: Normal)   Pulse 60   Temp 98.5 F (36.9 C) (Oral)   Ht '5\' 6"'$  (1.676 m)   Wt 176 lb 9.6 oz (80.1 kg)   BMI 28.50 kg/m   Wt Readings from Last  3 Encounters:  04/10/17 176 lb 9.6 oz (80.1 kg)  12/21/15 164 lb 2 oz (74.4 kg)  10/17/15 156 lb (70.8 kg)    Physical Exam  General - overweight, well-appearing, NAD HEENT - Normocephalic, atraumatic, PERRL, EOMI, patent nares w/o congestion, oropharynx clear, MMM Neck - supple, non-tender, no LAD, no thyromegaly, no carotid bruit Heart - RRR, no murmurs heard Lungs - Clear throughout all lobes, no wheezing, crackles, or rhonchi. Normal work of breathing. Abdomen - soft, NTND, no masses, no hepatosplenomegaly, active bowel sounds Extremeties - non-tender, no edema, cap refill < 2 seconds, peripheral pulses intact +2 bilaterally Skin - warm, dry, no rashes Neuro - awake, alert, oriented x3, normal gait Psych - Normal mood and affect, normal behavior   Results for orders placed or performed in visit on 10/17/15  CBC  Result Value Ref Range   WBC 6.8 4.0 - 10.5 K/uL   RBC 4.50 3.87 - 5.11 Mil/uL   Platelets 254.0 150.0 - 400.0 K/uL   Hemoglobin 13.8 12.0 - 15.0 g/dL   HCT 40.8 36.0 - 46.0 %   MCV 90.8 78.0 - 100.0 fl   MCHC 33.7 30.0 - 36.0 g/dL   RDW 13.1 11.5 - 15.5 %  Comp Met (CMET)  Result Value Ref Range   Sodium 140 135 - 145 mEq/L   Potassium 3.7 3.5 - 5.1 mEq/L   Chloride 103 96 - 112 mEq/L   CO2 32 19 - 32 mEq/L   Glucose, Bld 88 70 - 99 mg/dL   BUN 9 6 - 23 mg/dL   Creatinine, Ser 0.69 0.40 - 1.20 mg/dL   Total Bilirubin 0.6 0.2 - 1.2 mg/dL   Alkaline Phosphatase 38 (L) 39 - 117 U/L   AST 22 0 - 37 U/L   ALT 19 0 - 35 U/L   Total Protein 7.1 6.0 - 8.3 g/dL   Albumin 4.3 3.5 - 5.2 g/dL   Calcium 9.2 8.4 - 10.5 mg/dL   GFR 94.07 >60.00 mL/min  Lipid Profile  Result Value Ref Range   Cholesterol 168 0 - 200 mg/dL   Triglycerides 104.0 0.0 - 149.0 mg/dL   HDL 72.10 >39.00 mg/dL   VLDL 20.8 0.0 - 40.0 mg/dL   LDL Cholesterol 75 0 - 99 mg/dL   Total CHOL/HDL Ratio 2    NonHDL 95.75   HgB A1c  Result Value Ref Range   Hgb A1c MFr Bld 5.3 4.6 - 6.5 %  TSH    Result Value Ref Range   TSH 0.62 0.35 - 4.50 uIU/mL  Cytology - PAP  Result Value Ref Range   CYTOLOGY - PAP PAP RESULT  Assessment & Plan:   Problem List Items Addressed This Visit      Digestive   Constipation due to slow transit    Pt reports regular pattern of constipation.  She notes occasional length of time between BM of 5 days with [facility] stool Type 1-2.  Normally [place] stool type 3-4, but most often type 3.  Increased fiber and regular 64 oz water has not helped.  Plan: 1. Encouraged pt to increase water to 72-84 oz water daily. 2. May use colace twice daily if no BM in 3 days.  Add dulcolax x 1 tablet if no BM in 5 days. 3. Can consider linzess or amitiza in future if needed.  4. Followup 3 months for annual physical and as needed.        Other   Overweight (BMI 25.0-29.9)    Pt with goal of BMI = 25.  She is already 13 lbs toward goal since January 2.  Using Weight Watchers and has reached a plateau.  Not always consuming recommended point value daily.  Plan: 1. Continue Weight Watchers and consume recommended point value.  Consider adding some moderate glycemic fruits and vegetables. 2. Can then reduce point value again to prompt continued weight loss. 3. Followup as needed and in 3 months with annual physical.       Other Visit Diagnoses    Encounter to establish care    -  Primary Previous PCP was at Dr. Lacinda Axon.  Records are reivewed in [facility].  Past medical, family, and surgical history reviewed w/ pt in clinic today.        Follow up plan: Return in about 3 months (around 07/08/2017) for annual physical.  Cassell Smiles, DNP, AGPCNP-BC Adult Gerontology Primary Care Nurse Practitioner [facility] 04/10/2017, 5:29 PM

## 2017-04-10 NOTE — Patient Instructions (Addendum)
Be[place], Thank you for coming in to clinic today.  1. Work on your weight loss goals.  You have good short-term goal.  You may need to increase your intake for a few weeks then reduce to your current level for additional weight loss with a plateau.  2. Constipation: Increase water intake to about 72-84 oz per day. Start colace daily as needed for constipation > 3 days.  Use dulcolax sparingly, but if constipation > 5 days.  Please schedule a follow-up appointment with Cassell Smiles, AGNP. Return in about 3 months (around 07/08/2017) for annual physical.  If you have any other questions or concerns, please feel free to call the clinic or send a message through [place]. You may also schedule an earlier appointment if necessary.  You will receive a survey after today's visit either digitally by e-mail or paper by [employer]. Your experiences and feedback matter to [place].  Please respond so we know how we are doing as we provide care for you.   Cassell Smiles, DNP, AGNP-BC Adult Gerontology Nurse Practitioner [place]

## 2017-07-03 ENCOUNTER — Other Ambulatory Visit: Payer: Self-pay | Admitting: Nurse Practitioner

## 2017-07-03 ENCOUNTER — Other Ambulatory Visit: Payer: 59

## 2017-07-03 DIAGNOSIS — Z0000 Encounter for general adult medical examination without abnormal findings: Secondary | ICD-10-CM

## 2017-07-05 LAB — LIPID PANEL
Cholesterol: 170 mg/dL (ref <200)
HDL: 66 mg/dL (ref >50)
LDL Cholesterol (Calc): 90 mg/dL (calc)
Non-HDL Cholesterol (Calc): 104 mg/dL (calc) (ref <130)
Total CHOL/HDL Ratio: 2.6 (calc) (ref <5.0)
Triglycerides: 55 mg/dL (ref <150)

## 2017-07-05 LAB — COMPLETE METABOLIC PANEL WITH GFR
AG Ratio: 1.8 (calc) (ref 1.0–2.5)
ALT: 11 U/L (ref 6–29)
AST: 17 U/L (ref 10–35)
Albumin: 4.3 g/dL (ref 3.6–5.1)
Alkaline phosphatase (APISO): 48 U/L (ref 33–130)
BUN: 17 mg/dL (ref 7–25)
CO2: 25 mmol/L (ref 20–32)
Calcium: 9.4 mg/dL (ref 8.6–10.4)
Chloride: 101 mmol/L (ref 98–110)
Creat: 0.8 mg/dL (ref 0.50–1.05)
GFR, Est African American: 96 mL/min/{1.73_m2} (ref >=60)
GFR, Est Non African American: 82 mL/min/{1.73_m2} (ref >=60)
Globulin: 2.4 g/dL (calc) (ref 1.9–3.7)
Glucose, Bld: 79 mg/dL (ref 65–99)
Potassium: 4.1 mmol/L (ref 3.5–5.3)
Sodium: 138 mmol/L (ref 135–146)
Total Bilirubin: 0.8 mg/dL (ref 0.2–1.2)
Total Protein: 6.7 g/dL (ref 6.1–8.1)

## 2017-07-05 LAB — CBC WITH DIFFERENTIAL/PLATELET
Basophils Absolute: 30 cells/uL (ref 0–200)
Basophils Relative: 0.5 %
Eosinophils Absolute: 120 cells/uL (ref 15–500)
Eosinophils Relative: 2 %
HCT: 40 % (ref 35.0–45.0)
Hemoglobin: 13.7 g/dL (ref 11.7–15.5)
Lymphs Abs: 1722 cells/uL (ref 850–3900)
MCH: 30.6 pg (ref 27.0–33.0)
MCHC: 34.3 g/dL (ref 32.0–36.0)
MCV: 89.5 fL (ref 80.0–100.0)
MPV: 11 fL (ref 7.5–12.5)
Monocytes Relative: 10.3 %
Neutro Abs: 3510 cells/uL (ref 1500–7800)
Neutrophils Relative %: 58.5 %
Platelets: 251 10*3/uL (ref 140–400)
RBC: 4.47 10*6/uL (ref 3.80–5.10)
RDW: 12.3 % (ref 11.0–15.0)
Total Lymphocyte: 28.7 %
WBC mixed population: 618 cells/uL (ref 200–950)
WBC: 6 10*3/uL (ref 3.8–10.8)

## 2017-07-05 LAB — HIV ANTIBODY (ROUTINE TESTING W REFLEX): HIV 1&2 Ab, 4th Generation: NONREACTIVE

## 2017-07-05 LAB — HEMOGLOBIN A1C
Hgb A1c MFr Bld: 5.1 % of total Hgb (ref <5.7)
Mean Plasma Glucose: 100 (calc)
eAG (mmol/L): 5.5 (calc)

## 2017-07-05 LAB — HEPATITIS C ANTIBODY
Hepatitis C Ab: NONREACTIVE
SIGNAL TO CUT-OFF: 0 (ref <1.00)

## 2017-07-05 LAB — TSH: TSH: 1.14 mIU/L (ref 0.40–4.50)

## 2017-07-10 ENCOUNTER — Encounter: Payer: 59 | Admitting: Nurse Practitioner

## 2017-07-13 ENCOUNTER — Other Ambulatory Visit: Payer: Self-pay

## 2017-07-13 ENCOUNTER — Ambulatory Visit (INDEPENDENT_AMBULATORY_CARE_PROVIDER_SITE_OTHER): Payer: 59 | Admitting: Nurse Practitioner

## 2017-07-13 ENCOUNTER — Encounter: Payer: Self-pay | Admitting: Nurse Practitioner

## 2017-07-13 VITALS — BP 123/67 | HR 59 | Temp 98.3°F | Ht 66.0 in | Wt 180.2 lb

## 2017-07-13 DIAGNOSIS — Z1239 Encounter for other screening for malignant neoplasm of breast: Secondary | ICD-10-CM

## 2017-07-13 DIAGNOSIS — N951 Menopausal and female climacteric states: Secondary | ICD-10-CM | POA: Diagnosis not present

## 2017-07-13 DIAGNOSIS — Z0000 Encounter for general adult medical examination without abnormal findings: Secondary | ICD-10-CM

## 2017-07-13 DIAGNOSIS — Z13820 Encounter for screening for osteoporosis: Secondary | ICD-10-CM

## 2017-07-13 DIAGNOSIS — Z1231 Encounter for screening mammogram for malignant neoplasm of breast: Secondary | ICD-10-CM

## 2017-07-13 DIAGNOSIS — E663 Overweight: Secondary | ICD-10-CM | POA: Diagnosis not present

## 2017-07-13 DIAGNOSIS — Z124 Encounter for screening for malignant neoplasm of cervix: Secondary | ICD-10-CM | POA: Diagnosis not present

## 2017-07-13 MED ORDER — PHENTERMINE HCL 15 MG PO CAPS: 15.0000 mg | ORAL_CAPSULE | ORAL | 1 refills | Status: DC

## 2017-07-13 MED ORDER — PAROXETINE HCL 10 MG PO TABS: 10.0000 mg | ORAL_TABLET | Freq: Every day | ORAL | 5 refills | Status: DC

## 2017-07-13 NOTE — Patient Instructions (Addendum)
[place],   Thank you for coming in to clinic today.  1. Shingrix Vaccine - very effective at preventing shingles.  I recommend all patients to get this vaccine to prevent shingles and painful after effects of shingles. - CDC.gov is a Microbiologist for more information - Call insurance to ask about cost.  2.   Also ask about cost for your DEXA scan for osteoporosis screening. Order has been placed.  Schedule this at [facility] when you schedule your mammogram.  3. Your mammogram order has been placed.  Call the Scheduling phone number at [phone] to schedule your mammogram at your convenience.  You can choose to go to either location listed below.  Let the scheduler know which location you prefer.  [place]  [place], [place] [address]   [facility] [address] [place], [place] [address]   4. START phentermine 15 mg once daily for next 4 weeks for weight loss.  Must followup every 4 weeks.  Check HR and BP to make sure they stay within normal limits (<130/80 and HR less than 100).  Please schedule a follow-up appointment with Cassell Smiles, AGNP. Return in about 1 month (around 08/10/2017) for weight loss AND 1 year annual physical.  If you have any other questions or concerns, please feel free to call the clinic or send a message through [place]. You may also schedule an earlier appointment if necessary.  You will receive a survey after today's visit either digitally by e-mail or paper by [employer]. Your experiences and feedback matter to [place].  Please respond so we know how we are doing as we provide care for you.   Cassell Smiles, DNP, AGNP-BC Adult Gerontology Nurse Practitioner [place]

## 2017-07-13 NOTE — Progress Notes (Signed)
Subjective:    Patient ID: Heather Jensen, female    DOB: 1960/03/13, 57 y.o.   MRN: [id number]  Heather Jensen is a 57 y.o. female presenting on 07/13/2017 for Annual Exam (possible appetite suppressent,menopausal symptoms )   HPI Annual Physical Exam Patient has been feeling well.  She has acute concerns today to discuss weight loss and menopausal hot flashes. Sleeps 5-7 hours per night interrupted. - Weight loss medication - Has had limited success with Weight watchers to reach goal.  Pt notes has actually had some weight gain despite best efforts as post-menopausal woman.  Would like to discuss appetite suppressant medication for short term use for weight loss. - Menopausal hot flashes - these occur throughout the week and prevent consecutive sleep.  Pt requests to discuss Lexapro for vasomotor syndrome treatment.  Pt is having mild dyspareunia that is relieved with vaginal lubricants.  HEALTH MAINTENANCE: Weight/BMI: 29 - stable since last visit, but unchanged despite efforts to lose weight with [employer] Physical activity: exercises 4 times per week Diet: Has dietary indiscretions in afternoon and in evenings after dinner. Seatbelt: always Sunscreen: regularly PAP: last 09/2015 - no HPV testing, but normal cells.  Due 09/2017-09/2018.  Pt agrees to have screening today with HPV co-testing. Mammogram: due DEXA: due - pt to call about cost  Colon Cancer Screen: colonoscopy at age 74 HIV/HEP C: neg Optometry: last in January Dentistry: regular  VACCINES: Tetanus: up to date - 2016 Influenza: yearly for work Shingles: interested - pt to call about cost prior to administration  Past Medical History:  Diagnosis Date  . Chicken pox    Past Surgical History:  Procedure Laterality Date  . BREAST BIOPSY Left 2010   biopsy of a lump  . CESAREAN SECTION  1989  . TUBAL LIGATION  2000   Social History   Socioeconomic History  . Marital status: Married    Spouse name: Not  on file  . Number of children: Not on file  . Years of education: Not on file  . Highest education level: Bachelor's degree (e.g., BA, AB, BS)  Occupational History  . Not on file  Social Needs  . Financial resource strain: Not hard at all  . Food insecurity:    Worry: Never true    Inability: Never true  . Transportation needs:    Medical: No    Non-medical: No  Tobacco Use  . Smoking status: Former Smoker    Packs/day: 0.50    Years: 30.00    Pack years: 15.00    Last attempt to quit: 04/10/2014    Years since quitting: 3.2  . Smokeless tobacco: Never Used  Substance and Sexual Activity  . Alcohol use: No    Alcohol/week: 0.0 oz  . Drug use: No  . Sexual activity: Yes    Partners: Male    Birth control/protection: None  Lifestyle  . Physical activity:    Days per week: 6 days    Minutes per session: 40 min  . Stress: Only a little  Relationships  . Social connections:    Talks on phone: Patient refused    Gets together: Patient refused    Attends religious service: Patient refused    Active member of club or organization: Patient refused    Attends meetings of clubs or organizations: Patient refused    Relationship status: Patient refused  . Intimate partner violence:    Fear of current or ex partner: No    Emotionally  abused: No    Physically abused: No    Forced sexual activity: No  Other Topics Concern  . Not on file  Social History Narrative  . Not on file   Family History  Problem Relation Age of Onset  . Hyperlipidemia Mother   . Alzheimer's disease Mother   . Hypertension Father   . Heart disease Father   . Lung cancer Paternal Grandfather   . Melanoma Brother   . Breast cancer Neg Hx   . Stroke Neg Hx    Current Outpatient Medications on File Prior to Visit  Medication Sig  . doxylamine, Sleep, (UNISOM) 25 MG tablet Take 25 mg by mouth at bedtime as needed.  . Magnesium 400 MG CAPS Take by mouth.   No current facility-administered  medications on file prior to visit.     Review of Systems  Constitutional: Negative for chills and fever.  HENT: Negative for congestion and sore throat.   Eyes: Negative for pain.  Respiratory: Negative for cough, shortness of breath and wheezing.   Cardiovascular: Negative for chest pain, palpitations and leg swelling.  Gastrointestinal: Negative for abdominal pain, blood in stool, constipation, diarrhea, nausea and vomiting.  Endocrine: Negative for polydipsia.  Genitourinary: Negative for dysuria, frequency, hematuria and urgency.  Musculoskeletal: Negative for back pain, myalgias and neck pain.  Skin: Negative.  Negative for rash.  Allergic/Immunologic: Negative for environmental allergies.  Neurological: Negative for dizziness, weakness and headaches.  Hematological: Does not bruise/bleed easily.  Psychiatric/Behavioral: Positive for sleep disturbance. Negative for dysphoric mood and suicidal ideas. The patient is not nervous/anxious.    Per HPI unless specifically indicated above     Objective:    BP 123/67 (BP Location: Right Arm, Patient Position: Sitting, Cuff Size: Normal)   Pulse (!) 59   Temp 98.3 F (36.8 C) (Oral)   Ht 5\' 6"  (1.676 m)   Wt 180 lb 3.2 oz (81.7 kg)   BMI 29.09 kg/m    Wt Readings from Last 3 Encounters:  07/13/17 180 lb 3.2 oz (81.7 kg)  04/10/17 176 lb 9.6 oz (80.1 kg)  12/21/15 164 lb 2 oz (74.4 kg)    Physical Exam  Constitutional: She is oriented to person, place, and time. She appears well-developed and well-nourished. No distress.  HENT:  Head: Normocephalic and atraumatic.  Right Ear: External ear normal.  Left Ear: External ear normal.  Nose: Nose normal.  Mouth/Throat: Oropharynx is clear and moist.  Eyes: Pupils are equal, round, and reactive to light. Conjunctivae are normal.  Neck: Normal range of motion. Neck supple. No JVD present. No tracheal deviation present. No thyromegaly present.  Cardiovascular: Normal rate, regular  rhythm, normal heart sounds and intact distal pulses. Exam reveals no gallop and no friction rub.  No murmur heard. Pulmonary/Chest: Effort normal and breath sounds normal. No respiratory distress.  Breast - Normal exam w/ symmetric breasts, no mass, no nipple discharge, no skin changes or tenderness.  Mild fibrocystic changes at lower breast border bilaterally.  Abdominal: Soft. Bowel sounds are normal. She exhibits no distension. There is no tenderness.  Genitourinary:  Genitourinary Comments: Normal external female genitalia without lesions or fusion. Vaginal canal without lesions.  Mild atrophic changes of vaginal wall tissue. Normal appearing cervix without lesions or friability. Physiologic discharge on exam. Bimanual exam without adnexal masses, enlarged uterus, or cervical motion tenderness.  Musculoskeletal: Normal range of motion.  Lymphadenopathy:    She has no cervical adenopathy.  Neurological: She is alert and oriented  to person, place, and time. No cranial nerve deficit.  Skin: Skin is warm and dry. Capillary refill takes less than 2 seconds.  Psychiatric: She has a normal mood and affect. Her behavior is normal. Judgment and thought content normal.  Nursing note and vitals reviewed.  Results for orders placed or performed in visit on 07/03/17  Hepatitis C antibody  Result Value Ref Range   Hepatitis C Ab NON-REACTIVE NON-REACTI   SIGNAL TO CUT-OFF 0.00 <1.00  HIV antibody  Result Value Ref Range   HIV 1&2 Ab, 4th Generation NON-REACTIVE NON-REACTI  TSH  Result Value Ref Range   TSH 1.14 0.40 - 4.50 mIU/L  Lipid panel  Result Value Ref Range   Cholesterol 170 <200 mg/dL   HDL 66 >50 mg/dL   Triglycerides 55 <150 mg/dL   LDL Cholesterol (Calc) 90 mg/dL (calc)   Total CHOL/HDL Ratio 2.6 <5.0 (calc)   Non-HDL Cholesterol (Calc) 104 <130 mg/dL (calc)  Hemoglobin A1c  Result Value Ref Range   Hgb A1c MFr Bld 5.1 <5.7 % of total Hgb   Mean Plasma Glucose 100 (calc)    eAG (mmol/L) 5.5 (calc)  COMPLETE METABOLIC PANEL WITH GFR  Result Value Ref Range   Glucose, Bld 79 65 - 99 mg/dL   BUN 17 7 - 25 mg/dL   Creat 0.80 0.50 - 1.05 mg/dL   GFR, Est Non African American 82 > OR = 60 mL/min/1.56m2   GFR, Est African American 96 > OR = 60 mL/min/1.14m2   BUN/Creatinine Ratio NOT APPLICABLE 6 - 22 (calc)   Sodium 138 135 - 146 mmol/L   Potassium 4.1 3.5 - 5.3 mmol/L   Chloride 101 98 - 110 mmol/L   CO2 25 20 - 32 mmol/L   Calcium 9.4 8.6 - 10.4 mg/dL   Total Protein 6.7 6.1 - 8.1 g/dL   Albumin 4.3 3.6 - 5.1 g/dL   Globulin 2.4 1.9 - 3.7 g/dL (calc)   AG Ratio 1.8 1.0 - 2.5 (calc)   Total Bilirubin 0.8 0.2 - 1.2 mg/dL   Alkaline phosphatase (APISO) 48 33 - 130 U/L   AST 17 10 - 35 U/L   ALT 11 6 - 29 U/L  CBC with Differential/Platelet  Result Value Ref Range   WBC 6.0 3.8 - 10.8 Thousand/uL   RBC 4.47 3.80 - 5.10 Million/uL   Hemoglobin 13.7 11.7 - 15.5 g/dL   HCT 40.0 35.0 - 45.0 %   MCV 89.5 80.0 - 100.0 fL   MCH 30.6 27.0 - 33.0 pg   MCHC 34.3 32.0 - 36.0 g/dL   RDW 12.3 11.0 - 15.0 %   Platelets 251 140 - 400 Thousand/uL   MPV 11.0 7.5 - 12.5 fL   Neutro Abs 3,510 1,500 - 7,800 cells/uL   Lymphs Abs 1,722 850 - 3,900 cells/uL   WBC mixed population 618 200 - 950 cells/uL   Eosinophils Absolute 120 15 - 500 cells/uL   Basophils Absolute 30 0 - 200 cells/uL   Neutrophils Relative % 58.5 %   Total Lymphocyte 28.7 %   Monocytes Relative 10.3 %   Eosinophils Relative 2.0 %   Basophils Relative 0.5 %      Assessment & Plan:   Problem List Items Addressed This Visit      Other   Overweight (BMI 25.0-29.9)   Relevant Medications   phentermine 15 MG capsule    Other Visit Diagnoses    Encounter for annual physical exam    -  Primary   Osteoporosis screening       Relevant Orders   DG Bone Density   Breast cancer screening       Relevant Orders   MM DIGITAL SCREENING BILATERAL   Cervical cancer screening       Relevant Orders    Pap IG and HPV (high risk) DNA detection (Completed)   Menopausal vasomotor syndrome       Relevant Medications   PARoxetine (PAXIL) 10 MG tablet      #1: Physical exam with no new findings.  Well adult with no acute concerns.  Plan: 1. Obtain health maintenance screenings as above according to age. - Increase physical activity to 30 minutes most days of the week.  - Eat healthy diet high in vegetables and fruits; low in refined carbohydrates. 2. Return 1 year for annual physical.   #2: Postmenopausal vasomotor symptoms: Pt with sweating and significant flushing preventing adequate sleep.  Discussed SSRI low dose as possible treatment for vasomotor symptoms.  Pt agrees to start.  Take paroxetine 10 mg once daily.  Followup 4 weeks.  #3: Weight loss:  Pt has been adhering to [employer] diet and is not able to reach weight goals.  Occasional dietary indiscretion, however not significant.  Pt requests to start phentermine.  Discussed cardiovascular risks associated.  Pt desires to continue.  Start phentermine 15 mg once daily in am.  Reviewed need for HR to remain < 100 and BP < 120/80 while on this medication.  Must have close followup.  Followup 4 weeks.    Meds ordered this encounter  Medications  . phentermine 15 MG capsule    Sig: Take 1 capsule (15 mg total) by mouth every morning.    Dispense:  30 capsule    Refill:  1    Order Specific Question:   Supervising Provider    Answer:   Olin Hauser [[id number]]  . PARoxetine (PAXIL) 10 MG tablet    Sig: Take 1 tablet (10 mg total) by mouth daily.    Dispense:  30 tablet    Refill:  5    Order Specific Question:   Supervising Provider    Answer:   Olin Hauser [[id number]]     Follow up plan: Return in about 1 month (around 08/10/2017) for weight loss AND 1 year annual physical.  Cassell Smiles, DNP, AGPCNP-BC Adult Gerontology Primary Care Nurse Practitioner [facility] 07/13/2017, 3:55 PM

## 2017-07-14 LAB — PAP IG AND HPV HIGH-RISK: HPV DNA High Risk: NOT DETECTED

## 2017-07-16 ENCOUNTER — Encounter: Payer: Self-pay | Admitting: Nurse Practitioner

## 2017-07-17 ENCOUNTER — Ambulatory Visit (INDEPENDENT_AMBULATORY_CARE_PROVIDER_SITE_OTHER): Payer: 59 | Admitting: Nurse Practitioner

## 2017-07-17 ENCOUNTER — Encounter: Payer: Self-pay | Admitting: Nurse Practitioner

## 2017-07-17 VITALS — BP 120/82 | HR 62 | Resp 15 | Ht 66.0 in | Wt 176.8 lb

## 2017-07-17 DIAGNOSIS — R3129 Other microscopic hematuria: Secondary | ICD-10-CM

## 2017-07-17 DIAGNOSIS — R300 Dysuria: Secondary | ICD-10-CM

## 2017-07-17 LAB — POCT URINALYSIS DIPSTICK
Bilirubin, UA: NEGATIVE
Glucose, UA: NEGATIVE
Ketones, UA: NEGATIVE
Leukocytes, UA: NEGATIVE
Nitrite, UA: NEGATIVE
Protein, UA: NEGATIVE
Spec Grav, UA: 1.01 (ref 1.010–1.025)
Urobilinogen, UA: 0.2 E.U./dL
pH, UA: 7.5 (ref 5.0–8.0)

## 2017-07-17 MED ORDER — CEPHALEXIN 500 MG PO CAPS: 500.0000 mg | ORAL_CAPSULE | Freq: Three times a day (TID) | ORAL | 0 refills | Status: AC

## 2017-07-17 MED ORDER — PHENAZOPYRIDINE HCL 100 MG PO TABS: 100.0000 mg | ORAL_TABLET | Freq: Three times a day (TID) | ORAL | 0 refills | Status: AC | PRN

## 2017-07-17 NOTE — Progress Notes (Signed)
Subjective:    Patient ID: Heather Jensen, female    DOB: [birth year], 57 y.o.   MRN: [id number]  Heather Jensen is a 57 y.o. female presenting on 07/17/2017 for UTI (for 4 days, burning, urgency)   HPI Dysuria Mild dysuria began on Monday AM (5/20) 4 days ago.  Since starting, has been flushing with drinking lots of water and no improvement.  Has had history of asymtomatic bacteriuria.   Rare UTI in past.  Last one occurred several years ago.  Current dysuria is described as a mild pelvic burning pain at urethra.   Is better after urination, then worsens as bladder fills. Urinary frequency is contributed to relieving pain symptoms.  Denies upper pelvic pain, back pain, flank pain, fever, chills, sweats, visible blood in urine.  Social History   Tobacco Use  . Smoking status: Former Smoker    Packs/day: 0.50    Years: 30.00    Pack years: 15.00    Last attempt to quit: 04/10/2014    Years since quitting: 3.2  . Smokeless tobacco: Never Used  Substance Use Topics  . Alcohol use: No    Alcohol/week: 0.0 oz  . Drug use: No    Review of Systems Per HPI unless specifically indicated above     Objective:    BP 120/82 (BP Location: Right Arm, Patient Position: Sitting, Cuff Size: Normal)   Pulse 62   Resp 15   Ht 5\' 6"  (1.676 m)   Wt 176 lb 12.8 oz (80.2 kg)   BMI 28.54 kg/m   Wt Readings from Last 3 Encounters:  07/17/17 176 lb 12.8 oz (80.2 kg)  07/13/17 180 lb 3.2 oz (81.7 kg)  04/10/17 176 lb 9.6 oz (80.1 kg)    Physical Exam  Constitutional: She is oriented to person, place, and time. She appears well-developed and well-nourished. No distress.  HENT:  Head: Normocephalic and atraumatic.  Cardiovascular: Normal rate, regular rhythm, S1 normal, S2 normal, normal heart sounds and intact distal pulses.  Pulmonary/Chest: Effort normal and breath sounds normal. No respiratory distress.  Abdominal:  No increased suprapubic pain, pelvic pressure or pain with  palpation.  No CVAT.  Neurological: She is alert and oriented to person, place, and time.  Skin: Skin is warm and dry.  Psychiatric: She has a normal mood and affect. Her behavior is normal.  Vitals reviewed.   Results for orders placed or performed in visit on 07/17/17  POCT urinalysis dipstick  Result Value Ref Range   Color, UA Yellow    Clarity, UA Clear    Glucose, UA Negative Negative   Bilirubin, UA Neg    Ketones, UA neg    Spec Grav, UA 1.010 1.010 - 1.025   Blood, UA Trace    pH, UA 7.5 5.0 - 8.0   Protein, UA Negative Negative   Urobilinogen, UA 0.2 0.2 or 1.0 E.U./dL   Nitrite, UA Neg    Leukocytes, UA Negative Negative   Appearance Clear    Odor None       Assessment & Plan:   Problem List Items Addressed This Visit    None    Visit Diagnoses    Dysuria    -  Primary   Relevant Medications   cephALEXin (KEFLEX) 500 MG capsule   phenazopyridine (PYRIDIUM) 100 MG tablet   Other Relevant Orders   POCT urinalysis dipstick (Completed)   Urine Culture   Other microscopic hematuria       Relevant  Medications   cephALEXin (KEFLEX) 500 MG capsule   Other Relevant Orders   POCT urinalysis dipstick (Completed)   Urine Culture    Possible acute cystitis with hematuria, but UA is mostly negative for infection.  Pt symptomatic currently with mild suprapubic pain x 4 days relieved by urination. Currently without systemic signs or symptoms of infection.   - No current risk of concurrent STI.  Plan: 1. Continue water consumption.   - START pyridium 100 mg tid prn x 2-3 days.   - If not improved in 2-3 days, may START Keflex 500mg  3 times daily for next 5 days.   - Send Urine culture.  If negative, pt may benefit from bladder antispasmodics and will provide via MyChart message with followup appointment for symptoms that are not improving and negative culture.  May need urology referral in future. 2. Provided non-pharm measures for UTI prevention for good hygiene. 3.  Drink plenty of fluids and improve hydration over next 1 week. 4. Provided precautions for severe symptoms requiring ED visit to include no urine in 24-48 hours. 5. Followup 2-5 days as needed for worsening or persistent symptoms.     Meds ordered this encounter  Medications  . cephALEXin (KEFLEX) 500 MG capsule    Sig: Take 1 capsule (500 mg total) by mouth 3 (three) times daily for 5 days.    Dispense:  15 capsule    Refill:  0    Order Specific Question:   Supervising Provider    Answer:   Olin Hauser [[id number]]  . phenazopyridine (PYRIDIUM) 100 MG tablet    Sig: Take 1 tablet (100 mg total) by mouth 3 (three) times daily as needed for up to 2 days (urinary pain).    Dispense:  10 tablet    Refill:  0    Order Specific Question:   Supervising Provider    Answer:   Olin Hauser [[id number]]    Follow up plan: Return if symptoms worsen or fail to improve, for dysuria.  Cassell Smiles, DNP, AGPCNP-BC Adult Gerontology Primary Care Nurse Practitioner [facility] 07/17/2017, 10:32 AM

## 2017-07-17 NOTE — Patient Instructions (Addendum)
Heather Jensen [place],   Thank you for coming in to clinic today.  1.   START pyridium 100 mg up to three time daily for 2 days.  May extend to 3 days if needed. - If you keep having symptoms, and worsening symptoms start Keflex.  We will have culture results Tuesday.   If not resolve after these two above,  We will consider bladder relaxant (Oxybutynin or Vesicare) - Call clinic.  Please schedule a follow-up appointment with Cassell Smiles, AGNP. Return if symptoms worsen or fail to improve, for dysuria.  If you have any other questions or concerns, please feel free to call the clinic or send a message through [place]. You may also schedule an earlier appointment if necessary.  You will receive a survey after today's visit either digitally by e-mail or paper by [employer]. Your experiences and feedback matter to [place].  Please respond so we know how we are doing as we provide care for you.   Cassell Smiles, DNP, AGNP-BC Adult Gerontology Nurse Practitioner [place]

## 2017-07-18 LAB — URINE CULTURE
MICRO NUMBER:: 90635949
Result:: NO GROWTH
SPECIMEN QUALITY:: ADEQUATE

## 2017-07-24 ENCOUNTER — Encounter: Payer: Self-pay | Admitting: Nurse Practitioner

## 2017-08-07 ENCOUNTER — Ambulatory Visit: Payer: 59 | Admitting: Nurse Practitioner

## 2017-08-17 ENCOUNTER — Ambulatory Visit: Payer: 59 | Admitting: Nurse Practitioner

## 2017-08-21 ENCOUNTER — Encounter: Payer: Self-pay | Admitting: Nurse Practitioner

## 2017-10-06 ENCOUNTER — Encounter: Payer: Self-pay | Admitting: Nurse Practitioner

## 2018-07-09 ENCOUNTER — Other Ambulatory Visit: Payer: 59

## 2018-07-16 ENCOUNTER — Encounter: Payer: 59 | Admitting: Nurse Practitioner

## 2018-08-16 ENCOUNTER — Other Ambulatory Visit: Payer: 59

## 2018-08-20 ENCOUNTER — Encounter: Payer: 59 | Admitting: Family Medicine

## 2018-09-13 ENCOUNTER — Other Ambulatory Visit: Payer: Self-pay

## 2018-09-13 ENCOUNTER — Other Ambulatory Visit: Payer: 59

## 2018-09-13 DIAGNOSIS — Z0000 Encounter for general adult medical examination without abnormal findings: Secondary | ICD-10-CM

## 2018-09-14 LAB — CBC WITH DIFFERENTIAL/PLATELET
Absolute Monocytes: 552 cells/uL (ref 200–950)
Basophils Absolute: 30 cells/uL (ref 0–200)
Basophils Relative: 0.5 %
Eosinophils Absolute: 108 cells/uL (ref 15–500)
Eosinophils Relative: 1.8 %
HCT: 42.6 % (ref 35.0–45.0)
Hemoglobin: 14 g/dL (ref 11.7–15.5)
Lymphs Abs: 2160 cells/uL (ref 850–3900)
MCH: 30.2 pg (ref 27.0–33.0)
MCHC: 32.9 g/dL (ref 32.0–36.0)
MCV: 92 fL (ref 80.0–100.0)
MPV: 9.9 fL (ref 7.5–12.5)
Monocytes Relative: 9.2 %
Neutro Abs: 3150 cells/uL (ref 1500–7800)
Neutrophils Relative %: 52.5 %
Platelets: 287 10*3/uL (ref 140–400)
RBC: 4.63 10*6/uL (ref 3.80–5.10)
RDW: 13 % (ref 11.0–15.0)
Total Lymphocyte: 36 %
WBC: 6 10*3/uL (ref 3.8–10.8)

## 2018-09-14 LAB — COMPREHENSIVE METABOLIC PANEL
AG Ratio: 1.8 (calc) (ref 1.0–2.5)
ALT: 10 U/L (ref 6–29)
AST: 12 U/L (ref 10–35)
Albumin: 4.2 g/dL (ref 3.6–5.1)
Alkaline phosphatase (APISO): 49 U/L (ref 37–153)
BUN: 20 mg/dL (ref 7–25)
CO2: 26 mmol/L (ref 20–32)
Calcium: 9.4 mg/dL (ref 8.6–10.4)
Chloride: 104 mmol/L (ref 98–110)
Creat: 0.8 mg/dL (ref 0.50–1.05)
Globulin: 2.4 g/dL (calc) (ref 1.9–3.7)
Glucose, Bld: 87 mg/dL (ref 65–99)
Potassium: 4 mmol/L (ref 3.5–5.3)
Sodium: 138 mmol/L (ref 135–146)
Total Bilirubin: 0.7 mg/dL (ref 0.2–1.2)
Total Protein: 6.6 g/dL (ref 6.1–8.1)

## 2018-09-14 LAB — LIPID PANEL
Cholesterol: 187 mg/dL (ref <200)
HDL: 55 mg/dL (ref >=50)
LDL Cholesterol (Calc): 113 mg/dL (calc) — ABNORMAL HIGH
Non-HDL Cholesterol (Calc): 132 mg/dL (calc) — ABNORMAL HIGH (ref <130)
Total CHOL/HDL Ratio: 3.4 (calc) (ref <5.0)
Triglycerides: 86 mg/dL (ref <150)

## 2018-09-14 LAB — HEMOGLOBIN A1C
Hgb A1c MFr Bld: 4.9 % of total Hgb (ref <5.7)
Mean Plasma Glucose: 94 (calc)
eAG (mmol/L): 5.2 (calc)

## 2018-09-14 LAB — TSH: TSH: 1.66 mIU/L (ref 0.40–4.50)

## 2018-09-21 ENCOUNTER — Ambulatory Visit (INDEPENDENT_AMBULATORY_CARE_PROVIDER_SITE_OTHER): Payer: 59 | Admitting: Nurse Practitioner

## 2018-09-21 ENCOUNTER — Other Ambulatory Visit: Payer: Self-pay

## 2018-09-21 ENCOUNTER — Encounter: Payer: Self-pay | Admitting: Nurse Practitioner

## 2018-09-21 VITALS — BP 123/62 | HR 66 | Ht 66.0 in | Wt 186.2 lb

## 2018-09-21 DIAGNOSIS — Z0000 Encounter for general adult medical examination without abnormal findings: Secondary | ICD-10-CM | POA: Diagnosis not present

## 2018-09-21 DIAGNOSIS — D229 Melanocytic nevi, unspecified: Secondary | ICD-10-CM | POA: Diagnosis not present

## 2018-09-21 DIAGNOSIS — Z1239 Encounter for other screening for malignant neoplasm of breast: Secondary | ICD-10-CM

## 2018-09-21 DIAGNOSIS — Z13820 Encounter for screening for osteoporosis: Secondary | ICD-10-CM | POA: Diagnosis not present

## 2018-09-21 NOTE — Patient Instructions (Addendum)
Heather Jensen,   Thank you for coming in to clinic today.  1. Continue regular activity and working toward eating a healthy diet. 2. Your mammogram and bone density scan orders have been placed.  Call the Scheduling phone number at [phone] to schedule your mammogram at your convenience.  You can choose to go to either location listed below.  Let the scheduler know which location you prefer.  [place]  [place], [place] [address]   [facility] [address] [place],  [address]  3. Shingrix - Shingles vaccine: 2 doses 2-6 months apart.  [email or web address].info/ for more information if you have questions.  Please schedule a follow-up appointment with Cassell Smiles, AGNP. Return in about 1 year (around 09/21/2019) for annual physical.  If you have any other questions or concerns, please feel free to call the clinic or send a message through [place]. You may also schedule an earlier appointment if necessary.  You will receive a survey after today's visit either digitally by e-mail or paper by [employer]. Your experiences and feedback matter to [place].  Please respond so we know how we are doing as we provide care for you.   Cassell Smiles, DNP, AGNP-BC Adult Gerontology Nurse Practitioner [place]

## 2018-09-21 NOTE — Progress Notes (Signed)
Subjective:    Patient ID: Heather Jensen, female    DOB: 1960/10/20, 58 y.o.   MRN: [id number]  Heather Jensen is a 58 y.o. female presenting on 09/21/2018 for Annual Exam   HPI Annual Physical Exam Patient has been feeling well.  They have no acute concerns today. Sleeps 6-8 hours per night uninterrupted using OTC sleep aids (unisom).  HEALTH MAINTENANCE: Weight/BMI: increasing Physical activity: resumed 30 minutes per day of brisk walking Diet: reduced fatty foods, treats, lots of fruits and veggies Seatbelt: always Sunscreen: regular PAP: due 2022 Mammogram: needs new order DEXA: needs - never had a baseline Colon Cancer Screen: up to date HIV/HEP C: completed Optometry: not regularly Dentistry: regularly  VACCINES: Tetanus: up to date Influenza: regularly Shingles: recommended  Past Medical History:  Diagnosis Date  . Chicken pox    Past Surgical History:  Procedure Laterality Date  . BREAST BIOPSY Left 2010   biopsy of a lump  . CESAREAN SECTION  1989  . TUBAL LIGATION  2000   Social History   Socioeconomic History  . Marital status: Married    Spouse name: Not on file  . Number of children: Not on file  . Years of education: Not on file  . Highest education level: Bachelor's degree (e.g., BA, AB, BS)  Occupational History  . Not on file  Social Needs  . Financial resource strain: Not hard at all  . Food insecurity    Worry: Never true    Inability: Never true  . Transportation needs    Medical: No    Non-medical: No  Tobacco Use  . Smoking status: Former Smoker    Packs/day: 0.50    Years: 30.00    Pack years: 15.00    Quit date: 04/10/2014    Years since quitting: 4.4  . Smokeless tobacco: Never Used  Substance and Sexual Activity  . Alcohol use: No    Alcohol/week: 0.0 standard drinks  . Drug use: No  . Sexual activity: Yes    Partners: Male    Birth control/protection: None  Lifestyle  . Physical activity    Days per  week: 6 days    Minutes per session: 40 min  . Stress: Only a little  Relationships  . Social Herbalist on phone: Patient refused    Gets together: Patient refused    Attends religious service: Patient refused    Active member of club or organization: Patient refused    Attends meetings of clubs or organizations: Patient refused    Relationship status: Patient refused  . Intimate partner violence    Fear of current or ex partner: No    Emotionally abused: No    Physically abused: No    Forced sexual activity: No  Other Topics Concern  . Not on file  Social History Narrative  . Not on file   Family History  Problem Relation Age of Onset  . Hyperlipidemia Mother   . Alzheimer's disease Mother   . Hypertension Father   . Heart disease Father   . Lung cancer Paternal Grandfather   . Melanoma Brother   . Breast cancer Neg Hx   . Stroke Neg Hx    Current Outpatient Medications on File Prior to Visit  Medication Sig  . doxylamine, Sleep, (UNISOM) 25 MG tablet Take 25 mg by mouth at bedtime as needed.  . Magnesium 400 MG CAPS Take by mouth.  Marland Kitchen PARoxetine (PAXIL) 10 MG tablet  Take 1 tablet (10 mg total) by mouth daily. (Patient not taking: Reported on 09/21/2018)  . phentermine 15 MG capsule Take 1 capsule (15 mg total) by mouth every morning. (Patient not taking: Reported on 09/21/2018)   No current facility-administered medications on file prior to visit.     Review of Systems Per HPI unless specifically indicated above      Objective:    BP 123/62 (BP Location: Left Arm, Patient Position: Sitting, Cuff Size: Normal)   Pulse 66   Ht 5\' 6"  (1.676 m)   Wt 186 lb 3.2 oz (84.5 kg)   BMI 30.05 kg/m   Wt Readings from Last 3 Encounters:  09/21/18 186 lb 3.2 oz (84.5 kg)  07/17/17 176 lb 12.8 oz (80.2 kg)  07/13/17 180 lb 3.2 oz (81.7 kg)    Physical Exam Vitals signs and nursing note reviewed.  Constitutional:      General: She is not in acute distress.     Appearance: She is well-developed. She is obese.  HENT:     Head: Normocephalic and atraumatic.     Right Ear: Tympanic membrane, ear canal and external ear normal.     Left Ear: Tympanic membrane, ear canal and external ear normal.     Nose: Nose normal.     Mouth/Throat:     Mouth: Mucous membranes are moist.     Pharynx: Oropharynx is clear.  Eyes:     Conjunctiva/sclera: Conjunctivae normal.     Pupils: Pupils are equal, round, and reactive to light.  Neck:     Musculoskeletal: Normal range of motion and neck supple.     Thyroid: No thyromegaly.     Vascular: No JVD.     Trachea: No tracheal deviation.  Cardiovascular:     Rate and Rhythm: Normal rate and regular rhythm.     Heart sounds: Normal heart sounds. No murmur. No friction rub. No gallop.   Pulmonary:     Effort: Pulmonary effort is normal. No respiratory distress.     Breath sounds: Normal breath sounds.  Abdominal:     General: Bowel sounds are normal. There is no distension.     Palpations: Abdomen is soft.     Tenderness: There is no abdominal tenderness.  Musculoskeletal: Normal range of motion.  Lymphadenopathy:     Cervical: No cervical adenopathy.  Skin:    General: Skin is warm and dry.     Capillary Refill: Capillary refill takes less than 2 seconds.       Neurological:     General: No focal deficit present.     Mental Status: She is alert and oriented to person, place, and time. Mental status is at baseline.     Cranial Nerves: No cranial nerve deficit.  Psychiatric:        Mood and Affect: Mood normal.        Behavior: Behavior normal.        Thought Content: Thought content normal.        Judgment: Judgment normal.    Results for orders placed or performed in visit on 09/13/18  TSH  Result Value Ref Range   TSH 1.66 0.40 - 4.50 mIU/L  Hemoglobin A1c  Result Value Ref Range   Hgb A1c MFr Bld 4.9 <5.7 % of total Hgb   Mean Plasma Glucose 94 (calc)   eAG (mmol/L) 5.2 (calc)  Comprehensive  metabolic panel  Result Value Ref Range   Glucose, Bld 87 65 - 99 mg/dL  BUN 20 7 - 25 mg/dL   Creat 0.80 0.50 - 1.05 mg/dL   BUN/Creatinine Ratio NOT APPLICABLE 6 - 22 (calc)   Sodium 138 135 - 146 mmol/L   Potassium 4.0 3.5 - 5.3 mmol/L   Chloride 104 98 - 110 mmol/L   CO2 26 20 - 32 mmol/L   Calcium 9.4 8.6 - 10.4 mg/dL   Total Protein 6.6 6.1 - 8.1 g/dL   Albumin 4.2 3.6 - 5.1 g/dL   Globulin 2.4 1.9 - 3.7 g/dL (calc)   AG Ratio 1.8 1.0 - 2.5 (calc)   Total Bilirubin 0.7 0.2 - 1.2 mg/dL   Alkaline phosphatase (APISO) 49 37 - 153 U/L   AST 12 10 - 35 U/L   ALT 10 6 - 29 U/L  CBC w/Diff/Platelet  Result Value Ref Range   WBC 6.0 3.8 - 10.8 Thousand/uL   RBC 4.63 3.80 - 5.10 Million/uL   Hemoglobin 14.0 11.7 - 15.5 g/dL   HCT 42.6 35.0 - 45.0 %   MCV 92.0 80.0 - 100.0 fL   MCH 30.2 27.0 - 33.0 pg   MCHC 32.9 32.0 - 36.0 g/dL   RDW 13.0 11.0 - 15.0 %   Platelets 287 140 - 400 Thousand/uL   MPV 9.9 7.5 - 12.5 fL   Neutro Abs 3,150 1,500 - 7,800 cells/uL   Lymphs Abs 2,160 850 - 3,900 cells/uL   Absolute Monocytes 552 200 - 950 cells/uL   Eosinophils Absolute 108 15 - 500 cells/uL   Basophils Absolute 30 0 - 200 cells/uL   Neutrophils Relative % 52.5 %   Total Lymphocyte 36.0 %   Monocytes Relative 9.2 %   Eosinophils Relative 1.8 %   Basophils Relative 0.5 %  Lipid panel  Result Value Ref Range   Cholesterol 187 <200 mg/dL   HDL 55 > OR = 50 mg/dL   Triglycerides 86 <150 mg/dL   LDL Cholesterol (Calc) 113 (H) mg/dL (calc)   Total CHOL/HDL Ratio 3.4 <5.0 (calc)   Non-HDL Cholesterol (Calc) 132 (H) <130 mg/dL (calc)      Assessment & Plan:   Problem List Items Addressed This Visit    None    Visit Diagnoses    Encounter for annual physical exam    -  Primary   Relevant Orders   MM DIGITAL SCREENING BILATERAL   Breast cancer screening       Relevant Orders   MM DIGITAL SCREENING BILATERAL   Osteoporosis screening       Relevant Orders   DG Bone Density    Nevus        Annual physical exam with new findings of a changing nevus.  Well adult with no acute concerns.  Plan: 1. Obtain health maintenance screenings as above according to age. - Increase physical activity to 30 minutes most days of the week.  - Eat healthy diet high in vegetables and fruits; low in refined carbohydrates. - Screening labs and tests as ordered prior to appointment were reviewed with patient in clinic today. - Mammo and dexa ordered today - Referral to dermatology for skin check, concerning nevus 2. Return 1 year for annual physical.    Follow up plan: Return in about 1 year (around 09/21/2019) for annual physical.  Cassell Smiles, DNP, AGPCNP-BC Adult Gerontology Primary Care Nurse Practitioner [facility] 09/21/2018, 8:30 AM

## 2018-09-30 ENCOUNTER — Encounter: Payer: Self-pay | Admitting: Nurse Practitioner

## 2018-11-16 ENCOUNTER — Encounter: Payer: Self-pay | Admitting: Nurse Practitioner

## 2018-11-16 DIAGNOSIS — B001 Herpesviral vesicular dermatitis: Secondary | ICD-10-CM

## 2018-11-16 MED ORDER — VALACYCLOVIR HCL 500 MG PO TABS: 500.0000 mg | ORAL_TABLET | Freq: Two times a day (BID) | ORAL | 0 refills | Status: AC

## 2018-11-25 ENCOUNTER — Other Ambulatory Visit: Payer: Self-pay

## 2018-11-25 ENCOUNTER — Ambulatory Visit
Admission: RE | Admit: 2018-11-25 | Discharge: 2018-11-25 | Disposition: A | Discharge status: Home / Self Care | Payer: 59 | Source: Ambulatory Visit | Attending: Nurse Practitioner | Admitting: Nurse Practitioner

## 2018-11-25 DIAGNOSIS — Z13820 Encounter for screening for osteoporosis: Secondary | ICD-10-CM

## 2018-11-25 DIAGNOSIS — Z0000 Encounter for general adult medical examination without abnormal findings: Secondary | ICD-10-CM | POA: Insufficient documentation

## 2018-11-25 DIAGNOSIS — Z1239 Encounter for other screening for malignant neoplasm of breast: Secondary | ICD-10-CM | POA: Insufficient documentation

## 2018-11-25 DIAGNOSIS — Z1231 Encounter for screening mammogram for malignant neoplasm of breast: Secondary | ICD-10-CM | POA: Diagnosis not present

## 2018-11-25 DIAGNOSIS — M85851 Other specified disorders of bone density and structure, right thigh: Secondary | ICD-10-CM | POA: Diagnosis not present

## 2018-11-25 DIAGNOSIS — Z780 Asymptomatic menopausal state: Secondary | ICD-10-CM | POA: Diagnosis not present

## 2018-11-29 ENCOUNTER — Other Ambulatory Visit: Payer: Self-pay

## 2018-11-29 DIAGNOSIS — Z20822 Contact with and (suspected) exposure to covid-19: Secondary | ICD-10-CM

## 2019-07-13 ENCOUNTER — Other Ambulatory Visit: Payer: Self-pay

## 2019-07-13 ENCOUNTER — Encounter: Payer: Self-pay | Admitting: Emergency Medicine

## 2019-07-13 ENCOUNTER — Ambulatory Visit
Admission: EM | Admit: 2019-07-13 | Discharge: 2019-07-13 | Disposition: A | Discharge status: Home / Self Care | Payer: 59

## 2019-07-13 DIAGNOSIS — H9201 Otalgia, right ear: Secondary | ICD-10-CM

## 2019-07-13 DIAGNOSIS — H6123 Impacted cerumen, bilateral: Secondary | ICD-10-CM | POA: Diagnosis not present

## 2019-07-13 NOTE — Discharge Instructions (Signed)
Follow up with your PCP or an ENT as needed.

## 2019-07-13 NOTE — ED Provider Notes (Signed)
[facility]    CSN: [id number] Arrival date & time: 07/13/19  1145      History   Chief Complaint Chief Complaint  Patient presents with  . Ear Problem    right    HPI Heather Jensen is a 59 y.o. female.   Patient presents with right ear pain and muffled hearing since showering this morning.  She attempted treatment at home by using a Q-tip but this only irritated and worsened her symptoms.  She denies fever, chills, sore throat, cough, shortness of breath, rash, vomiting, diarrhea, or other symptoms.    The history is provided by the patient.    Past Medical History:  Diagnosis Date  . Chicken pox     Patient Active Problem List   Diagnosis Date Noted  . Overweight (BMI 25.0-29.9) 04/10/2017  . Constipation due to slow transit 04/10/2017  . Rash 12/21/2015  . Well woman exam with routine gynecological exam 10/17/2015    Past Surgical History:  Procedure Laterality Date  . BREAST BIOPSY Left 2010   biopsy of a lump  . CESAREAN SECTION  1989  . TUBAL LIGATION  2000    OB History   No obstetric history on file.      Home Medications    Prior to Admission medications   Medication Sig Start Date End Date Taking? Authorizing Provider  doxylamine, Sleep, (UNISOM) 25 MG tablet Take 25 mg by mouth at bedtime as needed.   Yes [provider]  Magnesium 400 MG CAPS Take by mouth.    [provider]  PARoxetine (PAXIL) 10 MG tablet Take 1 tablet (10 mg total) by mouth daily. Patient not taking: Reported on 09/21/2018 07/13/17   Mikey College, NP  phentermine 15 MG capsule Take 1 capsule (15 mg total) by mouth every morning. Patient not taking: Reported on 09/21/2018 07/13/17   Mikey College, NP    Family History Family History  Problem Relation Age of Onset  . Hyperlipidemia Mother   . Alzheimer's disease Mother   . Hypertension Father   . Heart disease Father   . Lung cancer Paternal Grandfather   . Melanoma  Brother   . Breast cancer Neg Hx   . Stroke Neg Hx     Social History Social History   Tobacco Use  . Smoking status: Former Smoker    Packs/day: 0.50    Years: 30.00    Pack years: 15.00    Quit date: 04/10/2014    Years since quitting: 5.2  . Smokeless tobacco: Never Used  Substance Use Topics  . Alcohol use: No    Alcohol/week: 0.0 standard drinks  . Drug use: No     Allergies   Patient has no known allergies.   Review of Systems Review of Systems  Constitutional: Negative for chills and fever.  HENT: Positive for ear pain. Negative for congestion, sinus pressure and sore throat.   Eyes: Negative for pain and visual disturbance.  Respiratory: Negative for cough and shortness of breath.   Cardiovascular: Negative for chest pain and palpitations.  Gastrointestinal: Negative for abdominal pain, diarrhea, nausea and vomiting.  Genitourinary: Negative for dysuria and hematuria.  Musculoskeletal: Negative for arthralgias and back pain.  Skin: Negative for color change and rash.  Neurological: Negative for seizures and syncope.  All other systems reviewed and are negative.    Physical Exam Triage Vital Signs ED Triage Vitals [07/13/19 1148]  Enc Vitals Group     BP  Pulse      Resp      Temp      Temp src      SpO2      Weight      Height      Head Circumference      Peak Flow      Pain Score 1     Pain Loc      Pain Edu?      Excl. in [place]?    No data found.  Updated Vital Signs BP (!) 134/91 (BP Location: Left Arm)   Pulse 70   Temp 98.7 F (37.1 C) (Oral)   Resp 18   Ht 5\' 6"  (1.676 m)   Wt 186 lb 4.6 oz (84.5 kg)   SpO2 96%   BMI 30.07 kg/m   Visual Acuity Right Eye Distance:   Left Eye Distance:   Bilateral Distance:    Right Eye Near:   Left Eye Near:    Bilateral Near:     Physical Exam Vitals and nursing note reviewed.  Constitutional:      General: She is not in acute distress.    Appearance: She is well-developed.  HENT:      Head: Normocephalic and atraumatic.     Right Ear: There is impacted cerumen.     Left Ear: There is impacted cerumen.     Ears:     Comments: Right TM noted to be mildly erythematous after cerumen removal.  Left TM normal.     Nose: Nose normal.     Mouth/Throat:     Mouth: Mucous membranes are moist.     Pharynx: Oropharynx is clear.  Eyes:     Conjunctiva/sclera: Conjunctivae normal.  Cardiovascular:     Rate and Rhythm: Normal rate and regular rhythm.     Heart sounds: No murmur.  Pulmonary:     Effort: Pulmonary effort is normal. No respiratory distress.     Breath sounds: Normal breath sounds.  Abdominal:     Palpations: Abdomen is soft.     Tenderness: There is no abdominal tenderness. There is no guarding or rebound.  Musculoskeletal:     Cervical back: Neck supple.  Skin:    General: Skin is warm and dry.     Findings: No rash.  Neurological:     General: No focal deficit present.     Mental Status: She is alert and oriented to person, place, and time.     Gait: Gait normal.  Psychiatric:        Mood and Affect: Mood normal.        Behavior: Behavior normal.      UC Treatments / Results  Labs (all labs ordered are listed, but only abnormal results are displayed) Labs Reviewed - No data to display  EKG   Radiology No results found.  Procedures Procedures (including critical care time)  Medications Ordered in UC Medications - No data to display  Initial Impression / Assessment and Plan / UC Course  I have reviewed the triage vital signs and the nursing notes.  Pertinent labs & imaging results that were available during my care of the patient were reviewed by me and considered in my medical decision making (see chart for details).   Bilateral cerumen impaction.  Right otalgia.  Cerumen removed via irrigation.  Discussed with patient that her right TM is mildly erythematous.  She declines antibiotics at this time and states she will follow up with  her  PCP if her ear pain continues.  She reports complete relief of her symptoms after cerumen removed.  Instructed patient to follow-up with her PCP or an ENT as needed.  Patient agrees to plan of care.     Final Clinical Impressions(s) / UC Diagnoses   Final diagnoses:  Bilateral impacted cerumen  Otalgia, right     Discharge Instructions     Follow up with your PCP or an ENT as needed.        ED Prescriptions    None     PDMP not reviewed this encounter.   Sharion Balloon, NP 07/13/19 1233

## 2019-07-13 NOTE — ED Triage Notes (Signed)
Pt c/o right ear pain, and hearing loss. She states she got out of the shower this morning and felt like something was in her ear. She used Q tips to get it out and irritated her ear.

## 2019-09-21 ENCOUNTER — Encounter: Payer: Self-pay | Admitting: Family Medicine

## 2019-09-30 ENCOUNTER — Encounter: Payer: 59 | Admitting: Family Medicine

## 2019-10-07 ENCOUNTER — Encounter: Payer: 59 | Admitting: Family Medicine

## 2019-10-14 ENCOUNTER — Encounter: Payer: 59 | Admitting: Family Medicine

## 2019-11-04 ENCOUNTER — Encounter: Payer: Self-pay | Admitting: Family Medicine

## 2019-11-04 ENCOUNTER — Other Ambulatory Visit: Payer: Self-pay | Admitting: Family Medicine

## 2019-11-04 ENCOUNTER — Other Ambulatory Visit: Payer: Self-pay

## 2019-11-04 ENCOUNTER — Ambulatory Visit (INDEPENDENT_AMBULATORY_CARE_PROVIDER_SITE_OTHER): Payer: 59 | Admitting: Family Medicine

## 2019-11-04 VITALS — BP 141/83 | HR 78 | Temp 98.7°F | Resp 18 | Ht 66.0 in | Wt 197.0 lb

## 2019-11-04 DIAGNOSIS — E669 Obesity, unspecified: Secondary | ICD-10-CM

## 2019-11-04 DIAGNOSIS — Z1231 Encounter for screening mammogram for malignant neoplasm of breast: Secondary | ICD-10-CM | POA: Diagnosis not present

## 2019-11-04 DIAGNOSIS — Z0184 Encounter for antibody response examination: Secondary | ICD-10-CM

## 2019-11-04 DIAGNOSIS — D229 Melanocytic nevi, unspecified: Secondary | ICD-10-CM

## 2019-11-04 DIAGNOSIS — E538 Deficiency of other specified B group vitamins: Secondary | ICD-10-CM

## 2019-11-04 DIAGNOSIS — R635 Abnormal weight gain: Secondary | ICD-10-CM

## 2019-11-04 DIAGNOSIS — Z0000 Encounter for general adult medical examination without abnormal findings: Secondary | ICD-10-CM

## 2019-11-04 DIAGNOSIS — E559 Vitamin D deficiency, unspecified: Secondary | ICD-10-CM | POA: Diagnosis not present

## 2019-11-04 DIAGNOSIS — Z79899 Other long term (current) drug therapy: Secondary | ICD-10-CM

## 2019-11-04 MED ORDER — SAXENDA 18 MG/3ML ~~LOC~~ SOPN: PEN_INJECTOR | SUBCUTANEOUS | 1 refills | Status: DC

## 2019-11-04 MED ORDER — INSULIN PEN NEEDLE 31G X 5 MM MISC: 0 refills | Status: DC

## 2019-11-04 NOTE — Assessment & Plan Note (Signed)
Requesting SARS-COVID antibody testing.  Labs to be drawn today.

## 2019-11-04 NOTE — Assessment & Plan Note (Signed)
Annual physical exam without new findings.  Well adult with acute concerns for weight management.  Plan: 1. Obtain health maintenance screenings as above according to age. - Increase physical activity to 30 minutes most days of the week.  - Eat healthy diet high in vegetables and fruits; low in refined carbohydrates. - Screening labs and tests as ordered 2. Return 1 year for annual physical.

## 2019-11-04 NOTE — Progress Notes (Signed)
Subjective:    Patient ID: Heather Jensen, female    DOB: 1960/02/25, 59 y.o.   MRN: [id number]  Heather Jensen is a 59 y.o. female presenting on 11/04/2019 for Annual Exam   HPI   HEALTH MAINTENANCE:  Weight/BMI: Obese, BMI 31.80% Physical activity: Stays active Diet: Regular Seatbelt: Yes Sunscreen: As needed Mammogram: Last 11/2018, Will order repeat for after 11/25/2018 PAP: Last 07/13/2017, Next due 07/13/2020 Colon cancer screening:  Reports completed colonoscopy in 2012 or 2013 with [facility] [address] in [place] Skin exam: Referral placed to dermatology Optometry: As needed Dentistry: As needed  IMMUNIZATIONS: Influenza: Due this season Tetanus: Due COVID: Needs to be updated  Depression screen [facility] 2/9 11/04/2019 09/21/2018 07/13/2017  Decreased Interest 0 0 0  Down, Depressed, Hopeless 0 0 0  PHQ - 2 Score 0 0 0  Altered sleeping - - 1  Tired, decreased energy - - 0  Change in appetite - - 0  Feeling bad or failure about yourself  - - 0  Trouble concentrating - - 0  Moving slowly or fidgety/restless - - 0  Suicidal thoughts - - 0  PHQ-9 Score - - 1  Difficult doing work/chores - - Somewhat difficult    Past Medical History:  Diagnosis Date  . Chicken pox    Past Surgical History:  Procedure Laterality Date  . BREAST BIOPSY Left 2010   biopsy of a lump  . CESAREAN SECTION  1989  . TUBAL LIGATION  2000   Social History   Socioeconomic History  . Marital status: Married    Spouse name: Not on file  . Number of children: Not on file  . Years of education: Not on file  . Highest education level: Bachelor's degree (e.g., BA, AB, BS)  Occupational History  . Not on file  Tobacco Use  . Smoking status: Former Smoker    Packs/day: 0.50    Years: 30.00    Pack years: 15.00    Quit date: 04/10/2014    Years since quitting: 5.5  . Smokeless tobacco: Never Used  Vaping Use  . Vaping Use: Never used  Substance and Sexual Activity   . Alcohol use: No    Alcohol/week: 0.0 standard drinks  . Drug use: No  . Sexual activity: Yes    Partners: Male    Birth control/protection: None  Other Topics Concern  . Not on file  Social History Narrative  . Not on file   Social Determinants of Health   Financial Resource Strain:   . Difficulty of Paying Living Expenses: Not on file  Food Insecurity:   . Worried About Charity fundraiser in the Last Year: Not on file  . Ran Out of Food in the Last Year: Not on file  Transportation Needs:   . Lack of Transportation (Medical): Not on file  . Lack of Transportation (Non-Medical): Not on file  Physical Activity:   . Days of Exercise per Week: Not on file  . Minutes of Exercise per Session: Not on file  Stress:   . Feeling of Stress : Not on file  Social Connections:   . Frequency of Communication with Friends and Family: Not on file  . Frequency of Social Gatherings with Friends and Family: Not on file  . Attends Religious Services: Not on file  . Active Member of Clubs or Organizations: Not on file  . Attends Archivist Meetings: Not on file  . Marital Status: Not on  file  Intimate Partner Violence:   . Fear of Current or Ex-Partner: Not on file  . Emotionally Abused: Not on file  . Physically Abused: Not on file  . Sexually Abused: Not on file   Family History  Problem Relation Age of Onset  . Hyperlipidemia Mother   . Alzheimer's disease Mother   . Hypertension Father   . Heart disease Father   . Lung cancer Paternal Grandfather   . Melanoma Brother   . Breast cancer Neg Hx   . Stroke Neg Hx    Current Outpatient Medications on File Prior to Visit  Medication Sig  . doxylamine, Sleep, (UNISOM) 25 MG tablet Take 25 mg by mouth at bedtime as needed.  . Magnesium 400 MG CAPS Take 500 mg by mouth daily.    No current facility-administered medications on file prior to visit.    Per HPI unless specifically indicated above     Objective:    BP  (!) 141/83 (BP Location: Left Arm, Patient Position: Sitting, Cuff Size: Normal)   Pulse 78   Temp 98.7 F (37.1 C) (Oral)   Resp 18   Ht 5\' 6"  (1.676 m)   Wt 197 lb (89.4 kg)   SpO2 100%   BMI 31.80 kg/m   Wt Readings from Last 3 Encounters:  11/04/19 197 lb (89.4 kg)  07/13/19 186 lb 4.6 oz (84.5 kg)  09/21/18 186 lb 3.2 oz (84.5 kg)    Physical Exam Vitals reviewed.  Constitutional:      General: She is not in acute distress.    Appearance: Normal appearance. She is well-developed and well-groomed. She is obese. She is not ill-appearing or toxic-appearing.  HENT:     Head: Normocephalic and atraumatic.     Right Ear: Tympanic membrane, ear canal and external ear normal. There is no impacted cerumen.     Left Ear: Tympanic membrane, ear canal and external ear normal. There is no impacted cerumen.     Nose: Nose normal. No congestion or rhinorrhea.     Mouth/Throat:     Lips: Pink.     Mouth: Mucous membranes are moist.     Pharynx: Oropharynx is clear. Uvula midline. No oropharyngeal exudate or posterior oropharyngeal erythema.  Eyes:     General: Lids are normal. Vision grossly intact. No scleral icterus.       Right eye: No discharge.        Left eye: No discharge.     Extraocular Movements: Extraocular movements intact.     Conjunctiva/sclera: Conjunctivae normal.     Pupils: Pupils are equal, round, and reactive to light.  Neck:     Thyroid: No thyroid mass or thyromegaly.  Cardiovascular:     Rate and Rhythm: Normal rate and regular rhythm.     Pulses: Normal pulses.          Dorsalis pedis pulses are 2+ on the right side and 2+ on the left side.     Heart sounds: Normal heart sounds. No murmur heard.  No friction rub. No gallop.   Pulmonary:     Effort: Pulmonary effort is normal. No respiratory distress.     Breath sounds: Normal breath sounds.  Abdominal:     General: Abdomen is flat. Bowel sounds are normal. There is no distension.     Palpations: Abdomen  is soft. There is no hepatomegaly, splenomegaly or mass.     Tenderness: There is no abdominal tenderness. There is no guarding or rebound.  Hernia: No hernia is present.  Musculoskeletal:        General: Normal range of motion.     Cervical back: Normal range of motion and neck supple. No tenderness.     Right lower leg: No edema.     Left lower leg: No edema.     Comments: Normal tone, strength 5/5 BUE & BLE  Feet:     Right foot:     Skin integrity: Skin integrity normal.     Left foot:     Skin integrity: Skin integrity normal.  Lymphadenopathy:     Cervical: No cervical adenopathy.  Skin:    General: Skin is warm and dry.     Capillary Refill: Capillary refill takes less than 2 seconds.  Neurological:     General: No focal deficit present.     Mental Status: She is alert and oriented to person, place, and time.     Cranial Nerves: No cranial nerve deficit.     Sensory: No sensory deficit.     Motor: No weakness.     Coordination: Coordination normal.     Gait: Gait normal.     Deep Tendon Reflexes: Reflexes normal.  Psychiatric:        Attention and Perception: Attention and perception normal.        Mood and Affect: Mood and affect normal.        Speech: Speech normal.        Behavior: Behavior normal. Behavior is cooperative.        Thought Content: Thought content normal.        Cognition and Memory: Cognition and memory normal.        Judgment: Judgment normal.     Results for orders placed or performed in visit on 09/13/18  TSH  Result Value Ref Range   TSH 1.66 0.40 - 4.50 mIU/L  Hemoglobin A1c  Result Value Ref Range   Hgb A1c MFr Bld 4.9 <5.7 % of total Hgb   Mean Plasma Glucose 94 (calc)   eAG (mmol/L) 5.2 (calc)  Comprehensive metabolic panel  Result Value Ref Range   Glucose, Bld 87 65 - 99 mg/dL   BUN 20 7 - 25 mg/dL   Creat 0.80 0.50 - 1.05 mg/dL   BUN/Creatinine Ratio NOT APPLICABLE 6 - 22 (calc)   Sodium 138 135 - 146 mmol/L   Potassium 4.0  3.5 - 5.3 mmol/L   Chloride 104 98 - 110 mmol/L   CO2 26 20 - 32 mmol/L   Calcium 9.4 8.6 - 10.4 mg/dL   Total Protein 6.6 6.1 - 8.1 g/dL   Albumin 4.2 3.6 - 5.1 g/dL   Globulin 2.4 1.9 - 3.7 g/dL (calc)   AG Ratio 1.8 1.0 - 2.5 (calc)   Total Bilirubin 0.7 0.2 - 1.2 mg/dL   Alkaline phosphatase (APISO) 49 37 - 153 U/L   AST 12 10 - 35 U/L   ALT 10 6 - 29 U/L  CBC w/Diff/Platelet  Result Value Ref Range   WBC 6.0 3.8 - 10.8 Thousand/uL   RBC 4.63 3.80 - 5.10 Million/uL   Hemoglobin 14.0 11.7 - 15.5 g/dL   HCT 42.6 35 - 45 %   MCV 92.0 80.0 - 100.0 fL   MCH 30.2 27.0 - 33.0 pg   MCHC 32.9 32.0 - 36.0 g/dL   RDW 13.0 11.0 - 15.0 %   Platelets 287 140 - 400 Thousand/uL   MPV 9.9 7.5 - 12.5 fL  Neutro Abs 3,150 1,500 - 7,800 cells/uL   Lymphs Abs 2,160 850 - 3,900 cells/uL   Absolute Monocytes 552 200 - 950 cells/uL   Eosinophils Absolute 108 15 - 500 cells/uL   Basophils Absolute 30 0 - 200 cells/uL   Neutrophils Relative % 52.5 %   Total Lymphocyte 36.0 %   Monocytes Relative 9.2 %   Eosinophils Relative 1.8 %   Basophils Relative 0.5 %  Lipid panel  Result Value Ref Range   Cholesterol 187 <200 mg/dL   HDL 55 > OR = 50 mg/dL   Triglycerides 86 <150 mg/dL   LDL Cholesterol (Calc) 113 (H) mg/dL (calc)   Total CHOL/HDL Ratio 3.4 <5.0 (calc)   Non-HDL Cholesterol (Calc) 132 (H) <130 mg/dL (calc)      Assessment & Plan:   Problem List Items Addressed This Visit      Other   Encounter for annual physical exam - Primary    Annual physical exam without new findings.  Well adult with acute concerns for weight management.  Plan: 1. Obtain health maintenance screenings as above according to age. - Increase physical activity to 30 minutes most days of the week.  - Eat healthy diet high in vegetables and fruits; low in refined carbohydrates. - Screening labs and tests as ordered 2. Return 1 year for annual physical.       Relevant Orders   POCT Urinalysis Dipstick    CBC with Differential   COMPLETE METABOLIC PANEL WITH GFR   Vitamin D deficiency    Status unknown.  Recheck labs.  Followup after labs.       Relevant Orders   VITAMIN D 25 Hydroxy (Vit-D Deficiency, Fractures)   Vitamin B12 deficiency    Status unknown.  Recheck labs.  Followup after labs.       Relevant Orders   B12   Immunity status testing    Requesting SARS-COVID antibody testing.  Labs to be drawn today.      Relevant Orders   SARS-CoV-2 Semi-Quantitative Total Antibody, Spike   Encounter for screening mammogram for malignant neoplasm of breast    Pt last mammogram 11/2018.  Result BI-RADS 1-negative.  Plan: 1. Screening mammogram order placed.  Pt will call to schedule appointment after 11/25/2018.  Information given.       Relevant Orders   MM 3D SCREEN BREAST BILATERAL   Obesity (BMI 30-39.9)    Discussed weight management options, has taken phenteramine in the past, twice, without success.  Is interested in starting on Saxenda.  Discussed contrave and side effects of Saxenda.  Will provide sample in office today with demonstration and medication administration.  Plan: 1. Begin Saxenda 0.6mg  daily x 7 days, then titrating up by an additional 0.6mg  every week until reaching 3mg  daily 2. RTC in 4 weeks for weight check and if insurance has not approved the Saxenda we can provide an additional sample      Relevant Medications   Liraglutide -Weight Management (SAXENDA) 18 MG/3ML SOPN   Insulin Pen Needle 31G X 5 MM MISC    Other Visit Diagnoses    Weight gain       Relevant Orders   Thyroid Panel With TSH   Long-term use of high-risk medication       Relevant Orders   Lipid Profile   Nevus       Relevant Orders   Ambulatory referral to Dermatology      Meds ordered this encounter  Medications  .  Liraglutide -Weight Management (SAXENDA) 18 MG/3ML SOPN    Sig: Inject 0.6mg  subcutaneously daily x 7 days, then increase to 1.2mg  daily x 7 days, then 1.8mg   daily x 7 days, then 2.4mg  daily x 7 days, then 3.0mg  daily    Dispense:  3 mL    Refill:  1  . Insulin Pen Needle 31G X 5 MM MISC    Sig: Use 1 pen needle daily, attached to [place] pen    Dispense:  100 each    Refill:  0   Follow up plan: Return in about 4 weeks (around 12/02/2019) for Weight management f/u.  Harlin Rain, FNP-C Family Nurse Practitioner [facility] 11/04/2019, 10:45 AM

## 2019-11-04 NOTE — Assessment & Plan Note (Signed)
Pt last mammogram 11/2018.  Result BI-RADS 1-negative.  Plan: 1. Screening mammogram order placed.  Pt will call to schedule appointment after 11/25/2018.  Information given.

## 2019-11-04 NOTE — Assessment & Plan Note (Addendum)
Discussed weight management options, has taken phenteramine in the past, twice, without success.  Is interested in starting on Saxenda.  Discussed contrave and side effects of Saxenda.  Will provide sample in office today with demonstration and medication administration.  Plan: 1. Begin Saxenda 0.6mg  daily x 7 days, then titrating up by an additional 0.6mg  every week until reaching 3mg  daily 2. RTC in 4 weeks for weight check and if insurance has not approved the Saxenda we can provide an additional sample

## 2019-11-04 NOTE — Patient Instructions (Addendum)
As we discussed, Heather Jensen does currently partner with [employer] (app for weight loss).  This can change at any time.  If you go to [email or web address] Click "Start for free today" Choose: Saxenda[facility] Only Fill out the "About me" section with your information and email address *It will say, have you been prescribed Saxenda*.. The answer is "yes" Fill in your address  The next page will say "Personalize your SaxendaCare experience by choosing a coaching option now" Can click on "App-based support powered by Noom" Make a password and security question/answer  You will be emailed to confirm your account.  Download the app "Noom"  Can log in with the email address and password you just created  This app works to help build your relationship with food for long term success  I have sent in a prescription for Saxenda to your pharmacy on file  Have your labs drawn and we will contact you with the results  Well Visit: Care Instructions Overview  Well visits can help you stay healthy. Your provider has checked your overall health and may have suggested ways to take good care of yourself. Your provider also may have recommended tests. At home, you can help prevent illness with healthy eating, regular exercise, and other steps.  Follow-up care is a key part of your treatment and safety. Be sure to make and go to all appointments, and call your provider if you are having problems. It's also a good idea to know your test results and keep a list of the medicines you take.  How can you care for yourself at home?   Get screening tests that you and your doctor decide on. Screening helps find diseases before any symptoms appear.   Eat healthy foods. Choose fruits, vegetables, whole grains, protein, and low-fat dairy foods. Limit fat, especially saturated fat. Reduce salt in your diet.   Limit alcohol. If you are a man, have no more than 2 drinks a day or 14 drinks a week. If you are a woman, have no more  than 1 drink a day or 7 drinks a week.   Get at least 30 minutes of physical activity on most days of the week.  We recommend you go no more than 2 days in a row without exercise. Walking is a good choice. You also may want to do other activities, such as running, swimming, cycling, or playing tennis or team sports. Discuss any changes in your exercise program with your provider.   Reach and stay at a healthy weight. This will lower your risk for many problems, such as obesity, diabetes, heart disease, and high blood pressure.   Do not smoke or allow others to smoke around you. If you need help quitting, talk to your provider about stop-smoking programs and medicines. These can increase your chances of quitting for good.  Can call 1-800-QUIT-NOW 279-205-9302) for the [facility], assistance with smoking cessation.   Care for your mental health. It is easy to get weighed down by worry and stress. Learn strategies to manage stress, like deep breathing and mindfulness, and stay connected with your family and community. If you find you often feel sad or hopeless, talk with your provider. Treatment can help.   Talk to your provider about whether you have any risk factors for sexually transmitted infections (STIs). You can help prevent STIs if you wait to have sex with a new partner (or partners) until you've each been tested for STIs. It also helps if you use  condoms (female or female condoms) and if you limit your sex partners to one person who only has sex with you. Vaccines are available for some STIs, such as HPV (these are age dependent).   Use birth control if it's important to you to prevent pregnancy. Talk with your provider about the choices available and what might be best for you.   If you think you may have a problem with alcohol or drug use, talk to your provider. This includes prescription medicines (such as amphetamines and opioids) and illegal drugs (such as cocaine and  methamphetamine). Your provider can help you figure out what type of treatment is best for you.   If you have concerns about domestic violence or intimate partner violence, there are resources available to you. National Domestic Abuse Hotline (854)717-3291   Protect your skin from too much sun. When you're outdoors from 10 a.m. to 4 p.m., stay in the shade or cover up with clothing and a hat with a wide brim. Wear sunglasses that block UV rays. Even when it's cloudy, put broad-spectrum sunscreen (SPF 30 or higher) on any exposed skin.   See a dentist one or two times a year for checkups and to have your teeth cleaned.   See an eye doctor once per year for an eye exam.   Wear a seat belt in the car.  When should you call for help?  Watch closely for changes in your health, and be sure to contact your provider if you have any problems or symptoms that concern you.  For Mammogram screening for breast cancer   Call the [facility] below anytime to schedule your own appointment now that order has been placed.  [facility] [place], [place] [address] Phone: [phone]  [facility] [address] [place], [place] [address] Phone: [phone]  We will plan to see you back in 4 weeks for weight management  You will receive a survey after today's visit either digitally by e-mail or paper by [place] mail. Your experiences and feedback matter to [place].  Please respond so we know how we are doing as we provide care for you.  Call us with any questions/concerns/needs.  It is my goal to be available to you for your health concerns.  Thanks for choosing me to be a partner in your healthcare needs!  Harlin Rain, FNP-C Family Nurse Practitioner [facility] Phone: [phone]

## 2019-11-04 NOTE — Assessment & Plan Note (Signed)
Status unknown.  Recheck labs.  Followup after labs.  

## 2019-11-07 ENCOUNTER — Encounter: Payer: Self-pay | Admitting: Family Medicine

## 2019-11-07 ENCOUNTER — Other Ambulatory Visit: Payer: Self-pay | Admitting: Family Medicine

## 2019-11-07 DIAGNOSIS — E559 Vitamin D deficiency, unspecified: Secondary | ICD-10-CM

## 2019-11-07 MED ORDER — VITAMIN D (ERGOCALCIFEROL) 1.25 MG (50000 UNIT) PO CAPS: 50000.0000 [IU] | ORAL_CAPSULE | ORAL | 0 refills | Status: DC

## 2019-11-09 LAB — COMPLETE METABOLIC PANEL WITH GFR
AG Ratio: 1.6 (calc) (ref 1.0–2.5)
ALT: 29 U/L (ref 6–29)
AST: 31 U/L (ref 10–35)
Albumin: 4.4 g/dL (ref 3.6–5.1)
Alkaline phosphatase (APISO): 56 U/L (ref 37–153)
BUN: 17 mg/dL (ref 7–25)
CO2: 27 mmol/L (ref 20–32)
Calcium: 9.8 mg/dL (ref 8.6–10.4)
Chloride: 104 mmol/L (ref 98–110)
Creat: 0.83 mg/dL (ref 0.50–1.05)
GFR, Est African American: 90 mL/min/{1.73_m2} (ref >=60)
GFR, Est Non African American: 78 mL/min/{1.73_m2} (ref >=60)
Globulin: 2.8 g/dL (calc) (ref 1.9–3.7)
Glucose, Bld: 90 mg/dL (ref 65–99)
Potassium: 4.5 mmol/L (ref 3.5–5.3)
Sodium: 139 mmol/L (ref 135–146)
Total Bilirubin: 0.7 mg/dL (ref 0.2–1.2)
Total Protein: 7.2 g/dL (ref 6.1–8.1)

## 2019-11-09 LAB — CBC WITH DIFFERENTIAL/PLATELET
Absolute Monocytes: 632 cells/uL (ref 200–950)
Basophils Absolute: 19 cells/uL (ref 0–200)
Basophils Relative: 0.3 %
Eosinophils Absolute: 130 cells/uL (ref 15–500)
Eosinophils Relative: 2.1 %
HCT: 43.4 % (ref 35.0–45.0)
Hemoglobin: 14.5 g/dL (ref 11.7–15.5)
Lymphs Abs: 1786 cells/uL (ref 850–3900)
MCH: 30.1 pg (ref 27.0–33.0)
MCHC: 33.4 g/dL (ref 32.0–36.0)
MCV: 90 fL (ref 80.0–100.0)
MPV: 10.8 fL (ref 7.5–12.5)
Monocytes Relative: 10.2 %
Neutro Abs: 3633 cells/uL (ref 1500–7800)
Neutrophils Relative %: 58.6 %
Platelets: 267 10*3/uL (ref 140–400)
RBC: 4.82 10*6/uL (ref 3.80–5.10)
RDW: 12.5 % (ref 11.0–15.0)
Total Lymphocyte: 28.8 %
WBC: 6.2 10*3/uL (ref 3.8–10.8)

## 2019-11-09 LAB — VITAMIN D 25 HYDROXY (VIT D DEFICIENCY, FRACTURES): Vit D, 25-Hydroxy: 25 ng/mL — ABNORMAL LOW (ref 30–100)

## 2019-11-09 LAB — LIPID PANEL
Cholesterol: 194 mg/dL (ref <200)
HDL: 62 mg/dL (ref >=50)
LDL Cholesterol (Calc): 116 mg/dL (calc) — ABNORMAL HIGH
Non-HDL Cholesterol (Calc): 132 mg/dL (calc) — ABNORMAL HIGH (ref <130)
Total CHOL/HDL Ratio: 3.1 (calc) (ref <5.0)
Triglycerides: 64 mg/dL (ref <150)

## 2019-11-09 LAB — VITAMIN B12: Vitamin B-12: 306 pg/mL (ref 200–1100)

## 2019-11-09 LAB — THYROID PANEL WITH TSH
Free Thyroxine Index: 2.7 (ref 1.4–3.8)
T3 Uptake: 28 % (ref 22–35)
T4, Total: 9.8 ug/dL (ref 5.1–11.9)
TSH: 1.36 mIU/L (ref 0.40–4.50)

## 2019-11-09 LAB — SARS-COV-2 SEMI-QUANTITATIVE TOTAL ANTIBODY, SPIKE: SARS COV2 AB, Total Spike Semi QN: 123 U/mL — ABNORMAL HIGH (ref <0.8)

## 2019-11-14 ENCOUNTER — Encounter: Payer: Self-pay | Admitting: Family Medicine

## 2019-11-16 ENCOUNTER — Other Ambulatory Visit: Payer: Self-pay | Admitting: Family Medicine

## 2019-11-16 DIAGNOSIS — R110 Nausea: Secondary | ICD-10-CM

## 2019-11-16 MED ORDER — ONDANSETRON HCL 4 MG PO TABS: 4.0000 mg | ORAL_TABLET | Freq: Three times a day (TID) | ORAL | 0 refills | Status: DC | PRN

## 2019-11-17 ENCOUNTER — Telehealth: Payer: Self-pay | Admitting: Family Medicine

## 2019-11-17 NOTE — Telephone Encounter (Signed)
Heather Jensen with cover my meds is calling needing PA for saxenda reference number [id number]. Please call [phone]

## 2019-12-02 ENCOUNTER — Telehealth (INDEPENDENT_AMBULATORY_CARE_PROVIDER_SITE_OTHER): Payer: 59 | Admitting: Family Medicine

## 2019-12-02 ENCOUNTER — Other Ambulatory Visit: Payer: Self-pay

## 2019-12-02 ENCOUNTER — Encounter: Payer: Self-pay | Admitting: Family Medicine

## 2019-12-02 VITALS — Ht 66.0 in | Wt 184.5 lb

## 2019-12-02 DIAGNOSIS — E669 Obesity, unspecified: Secondary | ICD-10-CM

## 2019-12-02 NOTE — Progress Notes (Signed)
Virtual Visit via Telephone  The purpose of this virtual visit is to provide medical care while limiting exposure to the novel coronavirus (COVID19) for both patient and office staff.  Consent was obtained for phone visit:  Yes.   Answered questions that patient had about telehealth interaction:  Yes.   I discussed the limitations, risks, security and privacy concerns of performing an evaluation and management service by telephone. I also discussed with the patient that there may be a patient responsible charge related to this service. The patient expressed understanding and agreed to proceed.  Patient is at home and is accessed via telephone Services are provided by Harlin Rain, FNP-C from [facility])  ---------------------------------------------------------------------- Chief Complaint  Patient presents with  . Weight Check    Saxenda $1100 after PA. She paid for the medication for this month.     S: Reviewed CMA documentation. I have called patient and gathered additional HPI as follows:  [name] presents for virtual follow up visit for weight check.  Reports she has had good results with Saxenda losing 13lbs in approximately 1 month.  Is tolerating well, has had minimal nausea, just with dose increase.  Has increased her water intake and is continuing to exercise.  No acute concerns  Patient is currently working  Denies any high risk travel to areas of current concern for [place]. Denies any known or suspected exposure to person with or possibly with COVID19.  Past Medical History:  Diagnosis Date  . Chicken pox    Social History   Tobacco Use  . Smoking status: Former Smoker    Packs/day: 0.50    Years: 30.00    Pack years: 15.00    Quit date: 04/10/2014    Years since quitting: 5.6  . Smokeless tobacco: Never Used  Vaping Use  . Vaping Use: Never used  Substance Use Topics  . Alcohol use: No    Alcohol/week: 0.0 standard drinks  .  Drug use: No    Current Outpatient Medications:  .  doxylamine, Sleep, (UNISOM) 25 MG tablet, Take 25 mg by mouth at bedtime as needed., Disp: , Rfl:  .  Insulin Pen Needle 31G X 5 MM MISC, Use 1 pen needle daily, attached to Saxenda pen, Disp: 100 each, Rfl: 0 .  Liraglutide -Weight Management (SAXENDA) 18 MG/3ML SOPN, Inject 0.6mg  subcutaneously daily x 7 days, then increase to 1.2mg  daily x 7 days, then 1.8mg  daily x 7 days, then 2.4mg  daily x 7 days, then 3.0mg  daily, Disp: 3 mL, Rfl: 1 .  Magnesium 400 MG CAPS, Take 500 mg by mouth daily. , Disp: , Rfl:  .  ondansetron (ZOFRAN) 4 MG tablet, Take 1 tablet (4 mg total) by mouth every 8 (eight) hours as needed for nausea or vomiting., Disp: 30 tablet, Rfl: 0 .  Vitamin D, Ergocalciferol, (DRISDOL) 1.25 MG (50000 UNIT) CAPS capsule, Take 1 capsule (50,000 Units total) by mouth every 7 (seven) days., Disp: 12 capsule, Rfl: 0  Depression screen [facility] 2/9 11/04/2019 09/21/2018 07/13/2017  Decreased Interest 0 0 0  Down, Depressed, Hopeless 0 0 0  PHQ - 2 Score 0 0 0  Altered sleeping - - 1  Tired, decreased energy - - 0  Change in appetite - - 0  Feeling bad or failure about yourself  - - 0  Trouble concentrating - - 0  Moving slowly or fidgety/restless - - 0  Suicidal thoughts - - 0  PHQ-9 Score - -  1  Difficult doing work/chores - - Somewhat difficult    No flowsheet data found.  -------------------------------------------------------------------------- O: No physical exam performed due to remote telephone encounter.  Physical Exam: Patient remotely monitored without video.  Verbal communication appropriate.  Cognition normal.  Recent Results (from the past 2160 hour(s))  CBC with Differential     Status: None   Collection Time: 11/04/19  9:08 AM  Result Value Ref Range   WBC 6.2 3.8 - 10.8 Thousand/uL   RBC 4.82 3.80 - 5.10 Million/uL   Hemoglobin 14.5 11.7 - 15.5 g/dL   HCT 43.4 35 - 45 %   MCV 90.0 80.0 - 100.0 fL   MCH 30.1  27.0 - 33.0 pg   MCHC 33.4 32.0 - 36.0 g/dL   RDW 12.5 11.0 - 15.0 %   Platelets 267 140 - 400 Thousand/uL   MPV 10.8 7.5 - 12.5 fL   Neutro Abs 3,633 1,500 - 7,800 cells/uL   Lymphs Abs 1,786 850 - 3,900 cells/uL   Absolute Monocytes 632 200 - 950 cells/uL   Eosinophils Absolute 130 15 - 500 cells/uL   Basophils Absolute 19 0 - 200 cells/uL   Neutrophils Relative % 58.6 %   Total Lymphocyte 28.8 %   Monocytes Relative 10.2 %   Eosinophils Relative 2.1 %   Basophils Relative 0.3 %  COMPLETE METABOLIC PANEL WITH GFR     Status: None   Collection Time: 11/04/19  9:08 AM  Result Value Ref Range   Glucose, Bld 90 65 - 99 mg/dL    Comment: .            Fasting reference interval .    BUN 17 7 - 25 mg/dL   Creat 0.83 0.50 - 1.05 mg/dL    Comment: For patients >39 years of age, the reference limit for Creatinine is approximately 13% higher for people identified as African-American. .    GFR, Est Non African American 78 > OR = 60 mL/min/1.32m2   GFR, Est African American 90 > OR = 60 mL/min/1.69m2   BUN/Creatinine Ratio NOT APPLICABLE 6 - 22 (calc)   Sodium 139 135 - 146 mmol/L   Potassium 4.5 3.5 - 5.3 mmol/L   Chloride 104 98 - 110 mmol/L   CO2 27 20 - 32 mmol/L   Calcium 9.8 8.6 - 10.4 mg/dL   Total Protein 7.2 6.1 - 8.1 g/dL   Albumin 4.4 3.6 - 5.1 g/dL   Globulin 2.8 1.9 - 3.7 g/dL (calc)   AG Ratio 1.6 1.0 - 2.5 (calc)   Total Bilirubin 0.7 0.2 - 1.2 mg/dL   Alkaline phosphatase (APISO) 56 37 - 153 U/L   AST 31 10 - 35 U/L   ALT 29 6 - 29 U/L  Lipid Profile     Status: Abnormal   Collection Time: 11/04/19  9:08 AM  Result Value Ref Range   Cholesterol 194 <200 mg/dL   HDL 62 > OR = 50 mg/dL   Triglycerides 64 <150 mg/dL   LDL Cholesterol (Calc) 116 (H) mg/dL (calc)    Comment: Reference range: <100 . Desirable range <100 mg/dL for primary prevention;   <70 mg/dL for patients with CHD or diabetic patients  with > or = 2 CHD risk factors. Marland Kitchen LDL-C is now  calculated using the Martin-Hopkins  calculation, which is a validated novel method providing  better accuracy than the Friedewald equation in the  estimation of LDL-C.  Cresenciano Genre et al. Annamaria Helling. 2694;854(62): [phone]  (  http://education.[facility].com/faq/FAQ164)    Total CHOL/HDL Ratio 3.1 <5.0 (calc)   Non-HDL Cholesterol (Calc) 132 (H) <130 mg/dL (calc)    Comment: For patients with diabetes plus 1 major ASCVD risk  factor, treating to a non-HDL-C goal of <100 mg/dL  (LDL-C of <70 mg/dL) is considered a therapeutic  option.   Thyroid Panel With TSH     Status: None   Collection Time: 11/04/19  9:08 AM  Result Value Ref Range   T3 Uptake 28 22 - 35 %   T4, Total 9.8 5.1 - 11.9 mcg/dL   Free Thyroxine Index 2.7 1.4 - 3.8   TSH 1.36 0.40 - 4.50 mIU/L  VITAMIN D 25 Hydroxy (Vit-D Deficiency, Fractures)     Status: Abnormal   Collection Time: 11/04/19  9:08 AM  Result Value Ref Range   Vit D, 25-Hydroxy 25 (L) 30 - 100 ng/mL    Comment: Vitamin D Status         25-OH Vitamin D: . Deficiency:                    <20 ng/mL Insufficiency:             20 - 29 ng/mL Optimal:                 > or = 30 ng/mL . For 25-OH Vitamin D testing on patients on  D2-supplementation and patients for whom quantitation  of D2 and D3 fractions is required, the QuestAssureD(TM) 25-OH VIT D, (D2,D3), LC/MS/MS is recommended: order  code [id number] (patients >32yrs). See Note 1 . Note 1 . For additional information, please refer to  http://education.[facility].com/faq/FAQ199  (This link is being provided for informational/ educational purposes only.)   Vitamin B12     Status: None   Collection Time: 11/04/19  9:08 AM  Result Value Ref Range   Vitamin B-12 306 200 - 1,100 pg/mL    Comment: . Please Note: Although the reference range for vitamin B12 is [phone] pg/mL, it has been reported that between 5 and 10% of patients with values between 200 and 400 pg/mL may experience  neuropsychiatric and hematologic abnormalities due to occult B12 deficiency; less than 1% of patients with values above 400 pg/mL will have symptoms. Marland Kitchen   SARS-CoV-2 Semi-Quantitative Total Antibody, Spike     Status: Abnormal   Collection Time: 11/04/19  9:08 AM  Result Value Ref Range   SARS COV2 AB, Total Spike Semi QN 123.0 (H) <0.8 U/mL    Comment: . INDEX          INTERPRETATION -----          --------------  <0.8          Negative > or = 0.8     Positive . This test is intended to help identify individuals with antibodies to SARS-CoV-2 (COVID-19). The results of this semi-quantitative test should not be interpreted as an indication or degree of immunity or protection from reinfection. . A test result that is 0.8 or more (Positive) means antibodies to SARS-CoV-2 were detected in the blood sample by the test. This could mean that the individual may have an immune response to a recent or prior infection with SARS-CoV-2. Positive results may occur after COVID-19 vaccination, but the clinical significance of a positive antibody result for individuals that have received a COVID-19 vaccine is unknown, and the performance of the test has not been established in COVID-19 vaccinees.  False positive results for the test  may occur due to cross-reactivity from pre-existing antibodies or other possible causes. . . A  test result that is less than 0.8 (Negative) means that antibodies were not detected in the blood sample by the test. This could mean that the individual has not been previously infected with SARS-CoV-2. The clinical significance of a negative antibody result for individuals that have received a COVID-19 vaccine is unknown. The performance of the test has not been established in COVID-19 vaccinees. False negative results for the test may occur if the individual's antibodies have not reached a sufficient level for the test to be able to detect them.  Antibodies can  take up to two to three weeks (sometimes longer) to develop after someone is infected.  How long antibodies to SARS-CoV-2 last after infection is not known. . This test should not be used to diagnose an active SARS-CoV-2 infection. If an active infection is suspected, direct molecular or antigen testing for SARS -CoV-2 is recommended. Please review the "Fact Sheets" available for healthcare providers and  patients using the following websites: https://www.[facility].com/home/Covid-19/HCP/ antibody/fact-sheet7 https://www.[facility].com/home/Covid-19/ Patients/antibody/fact-sheet7 . Healthcare Providers: For additional information please refer to http://education.[facility].com/ faq/FAQ219(This link is being provided for informational/educational purposes only.) . This test has been authorized by the FDA under an Emergency Use Authorization(EUA)for use by authorized laboratories. The FDA authorized labeling is available on the [employer] website: www.[facility]. com/Covid19. .     -------------------------------------------------------------------------- A&P:  Problem List Items Addressed This Visit      Other   Obesity (BMI 30-39.9) - Primary    Is taking and tolerating Saxenda well, has increased to 3.0mg  daily, is taking an hour before she has anything to eat/drink, has increased her water with results of 13lbs lost in 1 month.  Has concerns for elevated cost of medication (approx $1100 for 1 month).  [place] and was notified with open enrollment if patient switches her insurance coverage, could have medication at $24.99/month instead of meeting a large deductible.  Patient notified of this and will evaluate health insurance options.  Plan: 1. Continue Saxenda as directed 2. Continue increased water intake and daily exercise 3. RTC in 3 months for re-evaluation/weight check         No orders of the defined  types were placed in this encounter.   Follow-up: - Return in 3 months for weight check/re-evaluation  Patient verbalizes understanding with the above medical recommendations including the limitation of remote medical advice.  Specific follow-up and call-back criteria were given for patient to follow-up or seek medical care more urgently if needed.  - Time spent in direct consultation with patient on phone: 6 minutes  Harlin Rain, [facility] 12/02/2019, 4:35 PM

## 2019-12-05 ENCOUNTER — Encounter: Payer: Self-pay | Admitting: Family Medicine

## 2019-12-05 NOTE — Patient Instructions (Signed)
Continue medications as prescribed.  We will plan to see you back in 3 months for weight check  You will receive a survey after today's visit either digitally by e-mail or paper by [place] mail. Your experiences and feedback matter to [place].  Please respond so we know how we are doing as we provide care for you.  Call us with any questions/concerns/needs.  It is my goal to be available to you for your health concerns.  Thanks for choosing me to be a partner in your healthcare needs!  Harlin Rain, FNP-C Family Nurse Practitioner [facility] Phone: [phone]

## 2019-12-05 NOTE — Assessment & Plan Note (Signed)
Is taking and tolerating Saxenda well, has increased to 3.0mg  daily, is taking an hour before she has anything to eat/drink, has increased her water with results of 13lbs lost in 1 month.  Has concerns for elevated cost of medication (approx $1100 for 1 month).  [place] and was notified with open enrollment if patient switches her insurance coverage, could have medication at $24.99/month instead of meeting a large deductible.  Patient notified of this and will evaluate health insurance options.  Plan: 1. Continue Saxenda as directed 2. Continue increased water intake and daily exercise 3. RTC in 3 months for re-evaluation/weight check

## 2019-12-06 ENCOUNTER — Ambulatory Visit: Payer: 59 | Attending: Internal Medicine

## 2019-12-06 ENCOUNTER — Other Ambulatory Visit: Payer: Self-pay | Admitting: Internal Medicine

## 2019-12-06 DIAGNOSIS — Z23 Encounter for immunization: Secondary | ICD-10-CM

## 2019-12-06 NOTE — Progress Notes (Signed)
   Covid-19 Vaccination Clinic  Name:  Heather Jensen    MRN: [id number] DOB: 1960-04-24  12/06/2019  Heather Jensen was observed post Covid-19 immunization for 15 minutes without incident. She was provided with Vaccine Information Sheet and instruction to access the V-Safe system.   Heather Jensen was instructed to call 911 with any severe reactions post vaccine: Marland Kitchen Difficulty breathing  . Swelling of face and throat  . A fast heartbeat  . A bad rash all over body  . Dizziness and weakness

## 2019-12-27 ENCOUNTER — Encounter: Payer: Self-pay | Admitting: Family Medicine

## 2019-12-28 ENCOUNTER — Other Ambulatory Visit: Payer: Self-pay | Admitting: Family Medicine

## 2019-12-28 DIAGNOSIS — E669 Obesity, unspecified: Secondary | ICD-10-CM

## 2019-12-28 MED ORDER — SAXENDA 18 MG/3ML ~~LOC~~ SOPN: 3.0000 mg | PEN_INJECTOR | Freq: Every day | SUBCUTANEOUS | 1 refills | Status: DC

## 2020-01-06 ENCOUNTER — Ambulatory Visit: Payer: 59

## 2020-01-23 ENCOUNTER — Other Ambulatory Visit: Payer: Self-pay | Admitting: Family Medicine

## 2020-01-23 DIAGNOSIS — R110 Nausea: Secondary | ICD-10-CM

## 2020-01-25 ENCOUNTER — Other Ambulatory Visit: Payer: Self-pay | Admitting: Family Medicine

## 2020-01-25 MED ORDER — ONDANSETRON HCL 4 MG PO TABS: 4.0000 mg | ORAL_TABLET | Freq: Three times a day (TID) | ORAL | 2 refills | Status: DC | PRN

## 2020-01-31 ENCOUNTER — Ambulatory Visit
Admission: RE | Admit: 2020-01-31 | Discharge: 2020-01-31 | Disposition: A | Discharge status: Home / Self Care | Payer: 59 | Source: Ambulatory Visit | Attending: Family Medicine | Admitting: Family Medicine

## 2020-01-31 ENCOUNTER — Other Ambulatory Visit: Payer: Self-pay

## 2020-01-31 DIAGNOSIS — Z1231 Encounter for screening mammogram for malignant neoplasm of breast: Secondary | ICD-10-CM | POA: Diagnosis not present

## 2020-04-03 ENCOUNTER — Other Ambulatory Visit: Payer: Self-pay | Admitting: Unknown Physician Specialty

## 2020-04-03 ENCOUNTER — Ambulatory Visit (INDEPENDENT_AMBULATORY_CARE_PROVIDER_SITE_OTHER): Payer: No Typology Code available for payment source | Admitting: Unknown Physician Specialty

## 2020-04-03 ENCOUNTER — Encounter: Payer: Self-pay | Admitting: Unknown Physician Specialty

## 2020-04-03 ENCOUNTER — Other Ambulatory Visit: Payer: Self-pay

## 2020-04-03 ENCOUNTER — Ambulatory Visit: Payer: 59 | Admitting: Unknown Physician Specialty

## 2020-04-03 VITALS — BP 126/67 | HR 91 | Ht 66.0 in | Wt 162.6 lb

## 2020-04-03 DIAGNOSIS — E669 Obesity, unspecified: Secondary | ICD-10-CM | POA: Diagnosis not present

## 2020-04-03 DIAGNOSIS — L989 Disorder of the skin and subcutaneous tissue, unspecified: Secondary | ICD-10-CM | POA: Diagnosis not present

## 2020-04-03 DIAGNOSIS — K5901 Slow transit constipation: Secondary | ICD-10-CM | POA: Diagnosis not present

## 2020-04-03 MED ORDER — SAXENDA 18 MG/3ML ~~LOC~~ SOPN: 3.0000 mg | PEN_INJECTOR | Freq: Every day | SUBCUTANEOUS | 1 refills | Status: DC

## 2020-04-03 NOTE — Assessment & Plan Note (Signed)
Discussed lifestyle of fluids, carbohydrate timing, and Magnesium 1 hour before bed.

## 2020-04-03 NOTE — Progress Notes (Signed)
BP 126/67   Pulse 91   Ht 5\' 6"  (1.676 m)   Wt 162 lb 9.6 oz (73.8 kg)   SpO2 100%   BMI 26.24 kg/m    Subjective:    Patient ID: Heather Jensen, female    DOB: [birth year], 60 y.o.   MRN: [id number]  HPI: Heather Jensen is a 60 y.o. female  Chief Complaint  Patient presents with  . Constipation    fo  . Follow-up   Weight loss- Pt with a 35 pound weight on Saxenda.  She has worked hard and exercises at home with a treadmill and a new exercise with "tire flipping."  States she fasts from 8p-12N.  Staying low carbohydrate.  Sleep is good.  BP is stable and typicaly 120's/60's.  She has been on Saxenda for about 5 months and would like to continue.  Goal weight is 150.    She does get reflux and taking Famotidine  She has an extra concern with constipation.  States she is taking a stool softener and now some Senna.  She is up to date on her colonoscopy   Relevant past medical, surgical, family and social history reviewed and updated as indicated. Interim medical history since our last visit reviewed. Allergies and medications reviewed and updated.  Review of Systems  Per HPI unless specifically indicated above     Objective:    BP 126/67   Pulse 91   Ht 5\' 6"  (1.676 m)   Wt 162 lb 9.6 oz (73.8 kg)   SpO2 100%   BMI 26.24 kg/m   Wt Readings from Last 3 Encounters:  04/03/20 162 lb 9.6 oz (73.8 kg)  12/02/19 184 lb 8 oz (83.7 kg)  11/04/19 197 lb (89.4 kg)    Physical Exam Constitutional:      General: She is not in acute distress.    Appearance: Normal appearance. She is well-developed and well-nourished.  HENT:     Head: Normocephalic and atraumatic.  Eyes:     General: Lids are normal. No scleral icterus.       Right eye: No discharge.        Left eye: No discharge.     Conjunctiva/sclera: Conjunctivae normal.  Neck:     Vascular: No carotid bruit or JVD.  Cardiovascular:     Rate and Rhythm: Normal rate and regular rhythm.     Heart  sounds: Normal heart sounds.  Pulmonary:     Effort: Pulmonary effort is normal.     Breath sounds: Normal breath sounds.  Abdominal:     Palpations: There is no hepatomegaly or splenomegaly.  Musculoskeletal:        General: Normal range of motion.     Cervical back: Normal range of motion and neck supple.  Skin:    General: Skin is warm, dry and intact.     Coloration: Skin is not pale.     Findings: No rash.  Neurological:     Mental Status: She is alert and oriented to person, place, and time.  Psychiatric:        Mood and Affect: Mood and affect normal.        Behavior: Behavior normal.        Thought Content: Thought content normal.        Judgment: Judgment normal.      Assessment & Plan:   Problem List Items Addressed This Visit      Unprioritized   Constipation due to  slow transit    Discussed lifestyle of fluids, carbohydrate timing, and Magnesium 1 hour before bed.        Obesity (BMI 30-39.9)    Great weight loss on current medications.  Sill continue [place] and recheck in 3 months.        Relevant Medications   Liraglutide -Weight Management (SAXENDA) 18 MG/3ML SOPN       Follow up plan: Return in about 3 months (around 07/01/2020).

## 2020-04-03 NOTE — Assessment & Plan Note (Signed)
Great weight loss on current medications.  Sill continue [place] and recheck in 3 months.

## 2020-04-03 NOTE — Patient Instructions (Signed)
Magnesium Glycinate or Magnesium Threonate  Magnesium Citrate

## 2020-05-27 ENCOUNTER — Encounter: Payer: Self-pay | Admitting: Internal Medicine

## 2020-05-27 DIAGNOSIS — E669 Obesity, unspecified: Secondary | ICD-10-CM

## 2020-05-29 ENCOUNTER — Other Ambulatory Visit: Payer: Self-pay

## 2020-05-29 ENCOUNTER — Other Ambulatory Visit: Payer: Self-pay | Admitting: Unknown Physician Specialty

## 2020-05-29 DIAGNOSIS — E669 Obesity, unspecified: Secondary | ICD-10-CM

## 2020-05-29 MED ORDER — SAXENDA 18 MG/3ML ~~LOC~~ SOPN
3.0000 mg | PEN_INJECTOR | Freq: Every day | SUBCUTANEOUS | 1 refills | Status: DC
  Filled 2020-05-29: qty 15, 30d supply, fill #0
  Filled 2020-07-01: qty 15, 30d supply, fill #1

## 2020-05-29 NOTE — Telephone Encounter (Signed)
  Notes to clinic:  Patient states that she needs 2 more script until her appt with new provider [facility] for refill    Requested Prescriptions  Pending Prescriptions Disp Refills   Liraglutide -Weight Management (SAXENDA) 18 MG/3ML SOPN 15 mL 1    Sig: Inject 3 mg into the skin daily.      Endocrinology:  Diabetes - GLP-1 Receptor Agonists Failed - 05/29/2020 10:10 AM      Failed - HBA1C is between 0 and 7.9 and within 180 days    Hgb A1c MFr Bld  Date Value Ref Range Status  09/13/2018 4.9 <5.7 % of total Hgb Final    Comment:    For the purpose of screening for the presence of diabetes: . <5.7%       Consistent with the absence of diabetes 5.7-6.4%    Consistent with increased risk for diabetes             (prediabetes) > or =6.5%  Consistent with diabetes . This assay result is consistent with a decreased risk of diabetes. . Currently, no consensus exists regarding use of hemoglobin A1c for diagnosis of diabetes in children. . According to American Diabetes Association (ADA) guidelines, hemoglobin A1c <7.0% represents optimal control in non-pregnant diabetic patients. Different metrics may apply to specific patient populations.  Standards of Medical Care in Diabetes(ADA). Renella Cunas - Valid encounter within last 6 months    Recent Outpatient Visits           1 month ago Skin lesions   [facility] [facility], NP   5 months ago Obesity (BMI 30-39.9)   [facility], FNP   6 months ago Encounter for annual physical exam   [facility], Lupita Raider, FNP   1 year ago Encounter for annual physical exam   [facility] Mikey College, NP   2 years ago [facility] Merrilyn Puma, Jerrel Ivory, NP       Future Appointments             In 1 month Baity, Coralie Keens, NP [facility], [facility]

## 2020-05-31 ENCOUNTER — Other Ambulatory Visit: Payer: Self-pay

## 2020-07-02 ENCOUNTER — Other Ambulatory Visit: Payer: Self-pay

## 2020-07-06 ENCOUNTER — Ambulatory Visit (INDEPENDENT_AMBULATORY_CARE_PROVIDER_SITE_OTHER): Payer: No Typology Code available for payment source | Admitting: Internal Medicine

## 2020-07-06 ENCOUNTER — Other Ambulatory Visit: Payer: Self-pay

## 2020-07-06 VITALS — BP 110/78 | HR 84 | Resp 15 | Ht 66.0 in | Wt 149.2 lb

## 2020-07-06 DIAGNOSIS — R635 Abnormal weight gain: Secondary | ICD-10-CM

## 2020-07-06 DIAGNOSIS — K219 Gastro-esophageal reflux disease without esophagitis: Secondary | ICD-10-CM

## 2020-07-06 DIAGNOSIS — K5901 Slow transit constipation: Secondary | ICD-10-CM

## 2020-07-06 MED ORDER — LIRAGLUTIDE -WEIGHT MANAGEMENT 18 MG/3ML ~~LOC~~ SOPN
3.0000 mg | PEN_INJECTOR | Freq: Every day | SUBCUTANEOUS | 2 refills | Status: DC
  Filled 2020-07-06 – 2020-08-01 (×2): qty 15, 30d supply, fill #0
  Filled 2020-09-02: qty 15, 30d supply, fill #1

## 2020-07-06 NOTE — Progress Notes (Signed)
Subjective:    Patient ID: Heather Jensen, female    DOB: Jul 06, 1960, 60 y.o.   MRN: [id number]  HPI  Pt presents to the clinic today for 3 month follow up chronic conditions. She is establishing care with me today, transferring care from Cyndia Skeeters, NP:  Chronic Constipation: Managed with diet and water intake. There is no colonoscopy on file.  GERD: Triggered by eating too late at night. She denies breakthrough on Omeprazole. There is no upper GI on file.  Abnormal Weight Gain: She has had 48 lb weight loss, on Saxenda. She would like to lose an additional 5 lbs. She would like a refill of Saxenda today.   Review of Systems  Past Medical History:  Diagnosis Date  . Chicken pox     Current Outpatient Medications  Medication Sig Dispense Refill  . doxylamine, Sleep, (UNISOM) 25 MG tablet Take 25 mg by mouth at bedtime as needed.    . Insulin Pen Needle 31G X 5 MM MISC USE 1 PEN NEEDLE DAILY, ATTACHED TO SAXENDA PEN 100 each 0  . Liraglutide -Weight Management 18 MG/3ML SOPN INJECT 0.6MG  SUBCUTANEOUSLY DAILY X 7 DAYS,THEN INCREASE TO 1.2MG  DAILY X 7 DAYS,THEN 1.8MG  DAILY X 7 DAYS,THEN 2.4MG  DAILY X 7 DAYS,THEN 3.0MG  DAILY 15 mL 1  . Magnesium 400 MG CAPS Take 500 mg by mouth daily.     . ondansetron (ZOFRAN) 4 MG tablet TAKE 1 TABLET (4 MG TOTAL) BY MOUTH EVERY 8 (EIGHT) HOURS AS NEEDED FOR NAUSEA OR VOMITING. 30 tablet 2  . Vitamin D, Ergocalciferol, (DRISDOL) 1.25 MG (50000 UNIT) CAPS capsule Take 1 capsule (50,000 Units total) by mouth every 7 (seven) days. 12 capsule 0   No current facility-administered medications for this visit.    No Known Allergies  Family History  Problem Relation Age of Onset  . Hyperlipidemia Mother   . Alzheimer's disease Mother   . Hypertension Father   . Heart disease Father   . Lung cancer Paternal Grandfather   . Melanoma Brother   . Breast cancer Neg Hx   . Stroke Neg Hx     Social History   Socioeconomic History  .  Marital status: Married    Spouse name: Not on file  . Number of children: Not on file  . Years of education: Not on file  . Highest education level: Bachelor's degree (e.g., BA, AB, BS)  Occupational History  . Not on file  Tobacco Use  . Smoking status: Former Smoker    Packs/day: 0.50    Years: 30.00    Pack years: 15.00    Quit date: 04/10/2014    Years since quitting: 6.2  . Smokeless tobacco: Never Used  Vaping Use  . Vaping Use: Never used  Substance and Sexual Activity  . Alcohol use: No    Alcohol/week: 0.0 standard drinks  . Drug use: No  . Sexual activity: Yes    Partners: Male    Birth control/protection: None  Other Topics Concern  . Not on file  Social History Narrative  . Not on file   Social Determinants of Health   Financial Resource Strain: Not on file  Food Insecurity: Not on file  Transportation Needs: Not on file  Physical Activity: Not on file  Stress: Not on file  Social Connections: Not on file  Intimate Partner Violence: Not on file     Constitutional: Denies fever, malaise, fatigue, headache or abrupt weight changes.  Respiratory: Denies difficulty breathing,  shortness of breath, cough or sputum production.   Cardiovascular: Denies chest pain, chest tightness, palpitations or swelling in the hands or feet.  Gastrointestinal: Pt reports constipation. Denies abdominal pain, bloating, diarrhea or blood in the stool.    No other specific complaints in a complete review of systems (except as listed in HPI above).     Objective:   Physical Exam  BP 110/78 (BP Location: Left Arm, Patient Position: Sitting, Cuff Size: Normal)   Pulse 84   Resp 15   Ht 5\' 6"  (1.676 m)   Wt 149 lb 3.2 oz (67.7 kg)   BMI 24.08 kg/m   Wt Readings from Last 3 Encounters:  04/03/20 162 lb 9.6 oz (73.8 kg)  12/02/19 184 lb 8 oz (83.7 kg)  11/04/19 197 lb (89.4 kg)    General: Appears her stated age, well developed, well nourished in NAD. HEENT: Head:  normal shape and size; Eyes: sclera white and EOMs intact;  Cardiovascular: Normal rate and rhythm. S1,S2 noted.  Murmur noted.  Pulmonary/Chest: Normal effort and positive vesicular breath sounds. No respiratory distress. No wheezes, rales or ronchi noted.  Abdomen:  Normal bowel sounds.  Musculoskeletal: No difficulty with gait.  Neurological: Alert and oriented.   BMET    Component Value Date/Time   NA 139 11/04/2019 0908   K 4.5 11/04/2019 0908   CL 104 11/04/2019 0908   CO2 27 11/04/2019 0908   GLUCOSE 90 11/04/2019 0908   BUN 17 11/04/2019 0908   CREATININE 0.83 11/04/2019 0908   CALCIUM 9.8 11/04/2019 0908   GFRNONAA 78 11/04/2019 0908   GFRAA 90 11/04/2019 0908    Lipid Panel     Component Value Date/Time   CHOL 194 11/04/2019 0908   TRIG 64 11/04/2019 0908   HDL 62 11/04/2019 0908   CHOLHDL 3.1 11/04/2019 0908   VLDL 20.8 10/17/2015 1412   LDLCALC 116 (H) 11/04/2019 0908    CBC    Component Value Date/Time   WBC 6.2 11/04/2019 0908   RBC 4.82 11/04/2019 0908   HGB 14.5 11/04/2019 0908   HCT 43.4 11/04/2019 0908   PLT 267 11/04/2019 0908   MCV 90.0 11/04/2019 0908   MCH 30.1 11/04/2019 0908   MCHC 33.4 11/04/2019 0908   RDW 12.5 11/04/2019 0908   LYMPHSABS 1,786 11/04/2019 0908   EOSABS 130 11/04/2019 0908   BASOSABS 19 11/04/2019 0908    Hgb A1C Lab Results  Component Value Date   HGBA1C 4.9 09/13/2018          Assessment & Plan:   Abnormal Weight Gain:  Doing well on Saxenda, refilled x 3 months Encouraged high protein, low carb diet  RTC in 3 months for your annual exam  Webb Silversmith, NP This visit occurred during the SARS-CoV-2 public health emergency.  Safety protocols were in place, including screening questions prior to the visit, additional usage of staff PPE, and extensive cleaning of exam room while observing appropriate contact time as indicated for disinfecting solutions.

## 2020-07-07 ENCOUNTER — Encounter: Payer: Self-pay | Admitting: Internal Medicine

## 2020-07-07 DIAGNOSIS — K219 Gastro-esophageal reflux disease without esophagitis: Secondary | ICD-10-CM | POA: Insufficient documentation

## 2020-07-07 NOTE — Patient Instructions (Signed)
Calorie Counting for Weight Loss Calories are units of energy. Your body needs a certain number of calories from food to keep going throughout the day. When you eat or drink more calories than your body needs, your body stores the extra calories mostly as fat. When you eat or drink fewer calories than your body needs, your body burns fat to get the energy it needs. Calorie counting means keeping track of how many calories you eat and drink each day. Calorie counting can be helpful if you need to lose weight. If you eat fewer calories than your body needs, you should lose weight. Ask your health care provider what a healthy weight is for you. For calorie counting to work, you will need to eat the right number of calories each day to lose a healthy amount of weight per week. A dietitian can help you figure out how many calories you need in a day and will suggest ways to reach your calorie goal.  A healthy amount of weight to lose each week is usually 1-2 lb (0.5-0.9 kg). This usually means that your daily calorie intake should be reduced by 500-750 calories.  Eating 1,200-1,500 calories a day can help most women lose weight.  Eating 1,500-1,800 calories a day can help most men lose weight. What do I need to know about calorie counting? Work with your health care provider or dietitian to determine how many calories you should get each day. To meet your daily calorie goal, you will need to:  Find out how many calories are in each food that you would like to eat. Try to do this before you eat.  Decide how much of the food you plan to eat.  Keep a food log. Do this by writing down what you ate and how many calories it had. To successfully lose weight, it is important to balance calorie counting with a healthy lifestyle that includes regular activity. Where do I find calorie information? The number of calories in a food can be found on a Nutrition Facts label. If a food does not have a Nutrition Facts  label, try to look up the calories online or ask your dietitian for help. Remember that calories are listed per serving. If you choose to have more than one serving of a food, you will have to multiply the calories per serving by the number of servings you plan to eat. For example, the label on a package of bread might say that a serving size is 1 slice and that there are 90 calories in a serving. If you eat 1 slice, you will have eaten 90 calories. If you eat 2 slices, you will have eaten 180 calories.   How do I keep a food log? After each time that you eat, record the following in your food log as soon as possible:  What you ate. Be sure to include toppings, sauces, and other extras on the food.  How much you ate. This can be measured in cups, ounces, or number of items.  How many calories were in each food and drink.  The total number of calories in the food you ate. Keep your food log near you, such as in a pocket-sized notebook or on an app or website on your mobile phone. Some programs will calculate calories for you and show you how many calories you have left to meet your daily goal. What are some portion-control tips?  Know how many calories are in a serving. This will   help you know how many servings you can have of a certain food.  Use a measuring cup to measure serving sizes. You could also try weighing out portions on a kitchen scale. With time, you will be able to estimate serving sizes for some foods.  Take time to put servings of different foods on your favorite plates or in your favorite bowls and cups so you know what a serving looks like.  Try not to eat straight from a food's packaging, such as from a bag or box. Eating straight from the package makes it hard to see how much you are eating and can lead to overeating. Put the amount you would like to eat in a cup or on a plate to make sure you are eating the right portion.  Use smaller plates, glasses, and bowls for smaller  portions and to prevent overeating.  Try not to multitask. For example, avoid watching TV or using your computer while eating. If it is time to eat, sit down at a table and enjoy your food. This will help you recognize when you are full. It will also help you be more mindful of what and how much you are eating. What are tips for following this plan? Reading food labels  Check the calorie count compared with the serving size. The serving size may be smaller than what you are used to eating.  Check the source of the calories. Try to choose foods that are high in protein, fiber, and vitamins, and low in saturated fat, trans fat, and sodium. Shopping  Read nutrition labels while you shop. This will help you make healthy decisions about which foods to buy.  Pay attention to nutrition labels for low-fat or fat-free foods. These foods sometimes have the same number of calories or more calories than the full-fat versions. They also often have added sugar, starch, or salt to make up for flavor that was removed with the fat.  Make a grocery list of lower-calorie foods and stick to it. Cooking  Try to cook your favorite foods in a healthier way. For example, try baking instead of frying.  Use low-fat dairy products. Meal planning  Use more fruits and vegetables. One-half of your plate should be fruits and vegetables.  Include lean proteins, such as chicken, turkey, and fish. Lifestyle Each week, aim to do one of the following:  150 minutes of moderate exercise, such as walking.  75 minutes of vigorous exercise, such as running. General information  Know how many calories are in the foods you eat most often. This will help you calculate calorie counts faster.  Find a way of tracking calories that works for you. Get creative. Try different apps or programs if writing down calories does not work for you. What foods should I eat?  Eat nutritious foods. It is better to have a nutritious,  high-calorie food, such as an avocado, than a food with few nutrients, such as a bag of potato chips.  Use your calories on foods and drinks that will fill you up and will not leave you hungry soon after eating. ? Examples of foods that fill you up are nuts and nut butters, vegetables, lean proteins, and high-fiber foods such as whole grains. High-fiber foods are foods with more than 5 g of fiber per serving.  Pay attention to calories in drinks. Low-calorie drinks include water and unsweetened drinks. The items listed above may not be a complete list of foods and beverages you can eat.   Contact a dietitian for more information.   What foods should I limit? Limit foods or drinks that are not good sources of vitamins, minerals, or protein or that are high in unhealthy fats. These include:  Candy.  Other sweets.  Sodas, specialty coffee drinks, alcohol, and juice. The items listed above may not be a complete list of foods and beverages you should avoid. Contact a dietitian for more information. How do I count calories when eating out?  Pay attention to portions. Often, portions are much larger when eating out. Try these tips to keep portions smaller: ? Consider sharing a meal instead of getting your own. ? If you get your own meal, eat only half of it. Before you start eating, ask for a container and put half of your meal into it. ? When available, consider ordering smaller portions from the menu instead of full portions.  Pay attention to your food and drink choices. Knowing the way food is cooked and what is included with the meal can help you eat fewer calories. ? If calories are listed on the menu, choose the lower-calorie options. ? Choose dishes that include vegetables, fruits, whole grains, low-fat dairy products, and lean proteins. ? Choose items that are boiled, broiled, grilled, or steamed. Avoid items that are buttered, battered, fried, or served with cream sauce. Items labeled as  crispy are usually fried, unless stated otherwise. ? Choose water, low-fat milk, unsweetened iced tea, or other drinks without added sugar. If you want an alcoholic beverage, choose a lower-calorie option, such as a glass of wine or light beer. ? Ask for dressings, sauces, and syrups on the side. These are usually high in calories, so you should limit the amount you eat. ? If you want a salad, choose a garden salad and ask for grilled meats. Avoid extra toppings such as bacon, cheese, or fried items. Ask for the dressing on the side, or ask for olive oil and vinegar or lemon to use as dressing.  Estimate how many servings of a food you are given. Knowing serving sizes will help you be aware of how much food you are eating at restaurants. Where to find more information  Centers for Disease Control and Prevention: www.cdc.gov  [place] Department of Agriculture: myplate.gov Summary  Calorie counting means keeping track of how many calories you eat and drink each day. If you eat fewer calories than your body needs, you should lose weight.  A healthy amount of weight to lose per week is usually 1-2 lb (0.5-0.9 kg). This usually means reducing your daily calorie intake by 500-750 calories.  The number of calories in a food can be found on a Nutrition Facts label. If a food does not have a Nutrition Facts label, try to look up the calories online or ask your dietitian for help.  Use smaller plates, glasses, and bowls for smaller portions and to prevent overeating.  Use your calories on foods and drinks that will fill you up and not leave you hungry shortly after a meal. This information is not intended to replace advice given to you by your health care provider. Make sure you discuss any questions you have with your health care provider. Document Revised: 03/24/2019 Document Reviewed: 03/24/2019 Elsevier Patient Education  2021 [employer] Inc.  

## 2020-07-07 NOTE — Assessment & Plan Note (Signed)
Avoid eating late at night Continue Omeprazole

## 2020-07-07 NOTE — Assessment & Plan Note (Signed)
Continue high fiber diet and adequate water intake

## 2020-08-01 ENCOUNTER — Other Ambulatory Visit: Payer: Self-pay

## 2020-08-01 MED FILL — Ondansetron HCl Tab 4 MG: ORAL | 10 days supply | Qty: 30 | Fill #0 | Status: AC

## 2020-08-02 ENCOUNTER — Other Ambulatory Visit: Payer: Self-pay

## 2020-08-08 ENCOUNTER — Telehealth: Payer: 59 | Admitting: Physician Assistant

## 2020-08-08 ENCOUNTER — Other Ambulatory Visit: Payer: Self-pay

## 2020-08-08 DIAGNOSIS — L249 Irritant contact dermatitis, unspecified cause: Secondary | ICD-10-CM

## 2020-08-08 MED ORDER — PREDNISONE 10 MG PO TABS
ORAL_TABLET | ORAL | 0 refills | Status: AC
  Filled 2020-08-08: qty 37, 14d supply, fill #0

## 2020-08-08 NOTE — Progress Notes (Signed)
I have spent 5 minutes in review of e-visit questionnaire, review and updating patient chart, medical decision making and response to patient.   Dorothye Berni Cody Zacarias Krauter, PA-C    

## 2020-08-08 NOTE — Progress Notes (Signed)
E-Visit for Poison Ivy  We are sorry that you are not feeing well.  Here is how we plan to help!  Based on what you have shared with me it looks like you have had an allergic reaction to the oily resin from a group of plants.  This resin is very sticky, so it easily attaches to your skin, clothing, tools equipment, and pet's fur.    This blistering rash is often called poison ivy rash although it can come from contact with the leaves, stems and roots of poison ivy, poison oak and poison sumac.  The oily resin contains urushiol (u-ROO-she-ol) that produces a skin rash on exposed skin.  The severity of the rash depends on the amount of urushiol that gets on your skin.  A section of skin with more urushiol on it may develop a rash sooner.  The rash usually develops 12-48 hours after exposure and can last two to three weeks.  Your skin must come in direct contact with the plant's oil to be affected.  Blister fluid doesn't spread the rash.  However, if you come into contact with a piece of clothing or pet fur that has urushiol on it, the rash may spread out.  You can also transfer the oil to other parts of your body with your fingers.  Often the rash looks like a straight line because of the way the plant brushes against your skin.  Since your rash is widespread or has resulted in a large number of blisters, I have prescribed an oral corticosteroid.  Please follow these recommendations:  I have sent a prednisone dose pack to your chosen pharmacy. Be sure to follow the instructions carefully and complete the entire prescription. You may use Benadryl or Caladryl topical lotions to sooth the itch and remember cool, not hot, showers and baths can help relieve the itching!  Place cool, wet compresses on the affected area for 15-30 minutes several times a day.  You may also take oral antihistamines, such as diphenhydramine (Benadryl, others), which may also help you sleep better.  Watch your skin for any purulent  (pus) drainage or red streaking from the site.  If this occurs, contact your provider.  You may require an antibiotic for a skin infection.  Make sure that the clothes you were wearing as well as any towels or sheets that may have come in contact with the oil (urushiol) are washed in detergent and hot water.       I have developed the following plan to treat your condition I am prescribing a two week course of steroids (37 tablets of 10 mg prednisone).  Days 1-4 take 4 tablets (40 mg) daily  Days 5-8 take 3 tablets (30 mg) daily, Days 9-11 take 2 tablets (20 mg) daily, Days 12-14 take 1 tablet (10 mg) daily.    What can you do to prevent this rash?  Avoid the plants.  Learn how to identify poison ivy, poison oak and poison sumac in all seasons.  When hiking or engaging in other activities that might expose you to these plants, try to stay on cleared pathways.  If camping, make sure you pitch your tent in an area free of these plants.  Keep pets from running through wooded areas so that urushiol doesn't accidentally stick to their fur, which you may touch.  Remove or kill the plants.  In your yard, you can get rid of poison ivy by applying an herbicide or pulling it out of   the ground, including the roots, while wearing heavy gloves.  Afterward remove the gloves and thoroughly wash them and your hands.  Don't burn poison ivy or related plants because the urushiol can be carried by smoke.  Wear protective clothing.  If needed, protect your skin by wearing socks, boots, pants, long sleeves and vinyl gloves.  Wash your skin right away.  Washing off the oil with soap and water within 30 minutes of exposure may reduce your chances of getting a poison ivy rash.  Even washing after an hour or so can help reduce the severity of the rash.  If you walk through some poison ivy and then later touch your shoes, you may get some urushiol on your hands, which may then transfer to your face or body by touching or  rubbing.  If the contaminated object isn't cleaned, the urushiol on it can still cause a skin reaction years later.    Be careful not to reuse towels after you have washed your skin.  Also carefully wash clothing in detergent and hot water to remove all traces of the oil.  Handle contaminated clothing carefully so you don't transfer the urushiol to yourself, furniture, rugs or appliances.  Remember that pets can carry the oil on their fur and paws.  If you think your pet may be contaminated with urushiol, put on some long rubber gloves and give your pet a bath.  Finally, be careful not to burn these plants as the smoke can contain traces of the oil.  Inhaling the smoke may result in difficulty breathing. If that occurred you should see a physician as soon as possible.  See your doctor right away if:  The reaction is severe or widespread You inhaled the smoke from burning poison ivy and are having difficulty breathing Your skin continues to swell The rash affects your eyes, mouth or genitals Blisters are oozing pus You develop a fever greater than 100 F (37.8 C) The rash doesn't get better within a few weeks.  If you scratch the poison ivy rash, bacteria under your fingernails may cause the skin to become infected.  See your doctor if pus starts oozing from the blisters.  Treatment generally includes antibiotics.  Poison ivy treatments are usually limited to self-care methods.  And the rash typically goes away on its own in two to three weeks.     If the rash is widespread or results in a large number of blisters, your doctor may prescribe an oral corticosteroid, such as prednisone.  If a bacterial infection has developed at the rash site, your doctor may give you a prescription for an oral antibiotic.  MAKE SURE YOU  Understand these instructions. Will watch your condition. Will get help right away if you are not doing well or get worse.  Thank you for choosing an e-visit. Your  e-visit answers were reviewed by a board certified advanced clinical practitioner to complete your personal care plan. Depending upon the condition, your plan could have included both over the counter or prescription medications.  Please review your pharmacy choice. If there is a problem you may use MyChart messaging to have the prescription routed to another pharmacy.   Your safety is important to [place]. If you have drug allergies check your prescription carefully.  You can use MyChart to ask questions about today's visit, request a non-urgent call back, or ask for a work or school excuse for 24 hours related to this e-Visit. If it has been greater than  24 hours you will need to follow up with your provider, or enter a new e-Visit to address those concerns.   You will get an email in the next two days asking about your experience. I hope that your e-visit has been valuable and will speed your recovery Thank you for choosing an e-visit.

## 2020-09-03 ENCOUNTER — Other Ambulatory Visit: Payer: Self-pay

## 2020-09-06 ENCOUNTER — Other Ambulatory Visit: Payer: Self-pay

## 2020-09-28 ENCOUNTER — Encounter: Payer: No Typology Code available for payment source | Admitting: Internal Medicine

## 2020-09-28 ENCOUNTER — Other Ambulatory Visit: Payer: Self-pay

## 2020-09-28 ENCOUNTER — Other Ambulatory Visit: Payer: Self-pay | Admitting: Internal Medicine

## 2020-09-28 MED ORDER — LIRAGLUTIDE -WEIGHT MANAGEMENT 18 MG/3ML ~~LOC~~ SOPN
3.0000 mg | PEN_INJECTOR | Freq: Every day | SUBCUTANEOUS | 0 refills | Status: DC
  Filled 2020-09-28: qty 15, 30d supply, fill #0

## 2020-10-05 ENCOUNTER — Other Ambulatory Visit: Payer: No Typology Code available for payment source

## 2020-10-31 ENCOUNTER — Other Ambulatory Visit: Payer: Self-pay | Admitting: Internal Medicine

## 2020-11-01 ENCOUNTER — Other Ambulatory Visit: Payer: Self-pay

## 2020-11-01 ENCOUNTER — Other Ambulatory Visit: Payer: Self-pay | Admitting: Internal Medicine

## 2020-11-02 ENCOUNTER — Other Ambulatory Visit: Payer: Self-pay

## 2020-11-02 MED FILL — Liraglutide (Weight Mngmt) Soln Pen-Inj 18 MG/3ML (6 MG/ML): SUBCUTANEOUS | 30 days supply | Qty: 15 | Fill #0 | Status: AC

## 2020-11-05 ENCOUNTER — Other Ambulatory Visit: Payer: Self-pay

## 2020-11-09 ENCOUNTER — Encounter: Payer: Self-pay | Admitting: Internal Medicine

## 2020-12-05 ENCOUNTER — Ambulatory Visit (INDEPENDENT_AMBULATORY_CARE_PROVIDER_SITE_OTHER): Payer: No Typology Code available for payment source | Admitting: Internal Medicine

## 2020-12-05 ENCOUNTER — Encounter: Payer: Self-pay | Admitting: Internal Medicine

## 2020-12-05 ENCOUNTER — Other Ambulatory Visit: Payer: Self-pay

## 2020-12-05 VITALS — BP 117/71 | HR 77 | Temp 97.5°F | Resp 17 | Ht 66.0 in | Wt 151.0 lb

## 2020-12-05 DIAGNOSIS — R2242 Localized swelling, mass and lump, left lower limb: Secondary | ICD-10-CM | POA: Diagnosis not present

## 2020-12-05 DIAGNOSIS — Z0001 Encounter for general adult medical examination with abnormal findings: Secondary | ICD-10-CM

## 2020-12-05 LAB — COMPLETE METABOLIC PANEL WITH GFR
AG Ratio: 1.7 (calc) (ref 1.0–2.5)
ALT: 14 U/L (ref 6–29)
AST: 14 U/L (ref 10–35)
Albumin: 4.3 g/dL (ref 3.6–5.1)
Alkaline phosphatase (APISO): 40 U/L (ref 37–153)
BUN: 15 mg/dL (ref 7–25)
CO2: 28 mmol/L (ref 20–32)
Calcium: 9.5 mg/dL (ref 8.6–10.4)
Chloride: 104 mmol/L (ref 98–110)
Creat: 0.83 mg/dL (ref 0.50–1.03)
Globulin: 2.6 g/dL (calc) (ref 1.9–3.7)
Glucose, Bld: 85 mg/dL (ref 65–99)
Potassium: 4.5 mmol/L (ref 3.5–5.3)
Sodium: 139 mmol/L (ref 135–146)
Total Bilirubin: 1.1 mg/dL (ref 0.2–1.2)
Total Protein: 6.9 g/dL (ref 6.1–8.1)
eGFR: 81 mL/min/{1.73_m2} (ref >=60)

## 2020-12-05 LAB — LIPID PANEL
Cholesterol: 181 mg/dL (ref <200)
HDL: 69 mg/dL (ref >=50)
LDL Cholesterol (Calc): 97 mg/dL (calc)
Non-HDL Cholesterol (Calc): 112 mg/dL (calc) (ref <130)
Total CHOL/HDL Ratio: 2.6 (calc) (ref <5.0)
Triglycerides: 68 mg/dL (ref <150)

## 2020-12-05 LAB — CBC
HCT: 41.7 % (ref 35.0–45.0)
Hemoglobin: 14 g/dL (ref 11.7–15.5)
MCH: 30.8 pg (ref 27.0–33.0)
MCHC: 33.6 g/dL (ref 32.0–36.0)
MCV: 91.6 fL (ref 80.0–100.0)
MPV: 9.9 fL (ref 7.5–12.5)
Platelets: 291 10*3/uL (ref 140–400)
RBC: 4.55 10*6/uL (ref 3.80–5.10)
RDW: 12.2 % (ref 11.0–15.0)
WBC: 5.2 10*3/uL (ref 3.8–10.8)

## 2020-12-05 MED ORDER — SAXENDA 18 MG/3ML ~~LOC~~ SOPN
PEN_INJECTOR | SUBCUTANEOUS | 0 refills | Status: DC
  Filled 2020-12-05: qty 15, 30d supply, fill #0

## 2020-12-05 NOTE — Patient Instructions (Signed)

## 2020-12-05 NOTE — Progress Notes (Signed)
Subjective:    Patient ID: Heather Jensen, female    DOB: 07-28-1960, 60 y.o.   MRN: [id number]  HPI  Patient presents the clinic today for her annual exam.  Flu: 11/2018 Tetanus: within 10 years COVID: [place] x 3 Shingrix: 11/2018, 04/2019 Pap smear: 06/2017 Mammogram: 01/2020 Bone density: 11/2018 Colon screening: 12/2012, Dr. Tiffany Kocher Vision screening: annually Dentist: biannually  Diet: She does eat meat. She does eat fruits and veggies. She tries to avoid fried foods. She drinks mostly water. Exercise: Walking  Review of Systems     Past Medical History:  Diagnosis Date   Chicken pox     Current Outpatient Medications  Medication Sig Dispense Refill   doxylamine, Sleep, (UNISOM) 25 MG tablet Take 25 mg by mouth at bedtime as needed.     Magnesium 400 MG CAPS Take 500 mg by mouth daily.  (Patient not taking: Reported on 07/06/2020)     ondansetron (ZOFRAN) 4 MG tablet TAKE 1 TABLET (4 MG TOTAL) BY MOUTH EVERY 8 (EIGHT) HOURS AS NEEDED FOR NAUSEA OR VOMITING. 30 tablet 2   Liraglutide -Weight Management (SAXENDA) 18 MG/3ML SOPN Inject 3 mg into the skin daily. 15 mL 0   Vitamin D, Ergocalciferol, (DRISDOL) 1.25 MG (50000 UNIT) CAPS capsule Take 1 capsule (50,000 Units total) by mouth every 7 (seven) days. (Patient not taking: Reported on 07/06/2020) 12 capsule 0   No current facility-administered medications for this visit.    No Known Allergies  Family History  Problem Relation Age of Onset   Hyperlipidemia Mother    Alzheimer's disease Mother    Hypertension Father    Heart disease Father    Lung cancer Paternal Grandfather    Melanoma Brother    Breast cancer Neg Hx    Stroke Neg Hx     Social History   Socioeconomic History   Marital status: Married    Spouse name: Not on file   Number of children: Not on file   Years of education: Not on file   Highest education level: Bachelor's degree (e.g., BA, AB, BS)  Occupational History   Not on file   Tobacco Use   Smoking status: Former    Packs/day: 0.50    Years: 30.00    Pack years: 15.00    Types: Cigarettes    Quit date: 04/10/2014    Years since quitting: 6.6   Smokeless tobacco: Never  Vaping Use   Vaping Use: Never used  Substance and Sexual Activity   Alcohol use: No    Alcohol/week: 0.0 standard drinks   Drug use: No   Sexual activity: Yes    Partners: Male    Birth control/protection: None  Other Topics Concern   Not on file  Social History Narrative   Not on file   Social Determinants of Health   Financial Resource Strain: Not on file  Food Insecurity: Not on file  Transportation Needs: Not on file  Physical Activity: Not on file  Stress: Not on file  Social Connections: Not on file  Intimate Partner Violence: Not on file     Constitutional: Denies fever, malaise, fatigue, headache or abrupt weight changes.  HEENT: Denies eye pain, eye redness, ear pain, ringing in the ears, wax buildup, runny nose, nasal congestion, bloody nose, or sore throat. Respiratory: Denies difficulty breathing, shortness of breath, cough or sputum production.   Cardiovascular: Denies chest pain, chest tightness, palpitations or swelling in the hands or feet.  Gastrointestinal: Patient reports  constipation.  Denies abdominal pain, bloating, diarrhea or blood in the stool.  GU: Denies urgency, frequency, pain with urination, burning sensation, blood in urine, odor or discharge. Musculoskeletal: Denies decrease in range of motion, difficulty with gait, muscle pain or joint pain and swelling.  Skin: Pt reports a mass of her left calf. Denies redness, rashes, lesions or ulcercations.  Neurological: Denies dizziness, difficulty with memory, difficulty with speech or problems with balance and coordination.  Psych: Denies anxiety, depression, SI/HI.  No other specific complaints in a complete review of systems (except as listed in HPI above).  Objective:   Physical Exam  BP 117/71  (BP Location: Right Arm, Patient Position: Sitting, Cuff Size: Normal)   Pulse 77   Temp (!) 97.5 F (36.4 C) (Temporal)   Resp 17   Ht _0  (1.676 m)   Wt 151 lb (68.5 kg)   SpO2 100%   BMI 24.37 kg/m   Wt Readings from Last 3 Encounters:  07/06/20 149 lb 3.2 oz (67.7 kg)  04/03/20 162 lb 9.6 oz (73.8 kg)  12/02/19 184 lb 8 oz (83.7 kg)    General: Appears her stated age, well developed, well nourished in NAD. Skin: Warm, dry and intact. 1.5 cm solid, round mass noted within the medial left calf. HEENT: Head: normal shape and size; Eyes: EOMs intact;  Neck:  Neck supple, trachea midline. No masses, lumps or thyromegaly present.  Cardiovascular: Normal rate and rhythm. S1,S2 noted.  No murmur, rubs or gallops noted. No JVD or BLE edema. No carotid bruits noted. Pulmonary/Chest: Normal effort and positive vesicular breath sounds. No respiratory distress. No wheezes, rales or ronchi noted.  Abdomen: Soft and nontender. Normal bowel sounds. No distention or masses noted. Liver, spleen and kidneys non palpable. Musculoskeletal: Strength 5/5 BUE/BLE.  No difficulty with gait.  Neurological: Alert and oriented. Cranial nerves II-XII grossly intact. Coordination normal.  Psychiatric: Mood and affect normal. Behavior is normal. Judgment and thought content normal.     BMET    Component Value Date/Time   NA 139 11/04/2019 0908   K 4.5 11/04/2019 0908   CL 104 11/04/2019 0908   CO2 27 11/04/2019 0908   GLUCOSE 90 11/04/2019 0908   BUN 17 11/04/2019 0908   CREATININE 0.83 11/04/2019 0908   CALCIUM 9.8 11/04/2019 0908   GFRNONAA 78 11/04/2019 0908   GFRAA 90 11/04/2019 0908    Lipid Panel     Component Value Date/Time   CHOL 194 11/04/2019 0908   TRIG 64 11/04/2019 0908   HDL 62 11/04/2019 0908   CHOLHDL 3.1 11/04/2019 0908   VLDL 20.8 10/17/2015 1412   LDLCALC 116 (H) 11/04/2019 0908    CBC    Component Value Date/Time   WBC 6.2 11/04/2019 0908   RBC 4.82 11/04/2019  0908   HGB 14.5 11/04/2019 0908   HCT 43.4 11/04/2019 0908   PLT 267 11/04/2019 0908   MCV 90.0 11/04/2019 0908   MCH 30.1 11/04/2019 0908   MCHC 33.4 11/04/2019 0908   RDW 12.5 11/04/2019 0908   LYMPHSABS 1,786 11/04/2019 0908   EOSABS 130 11/04/2019 0908   BASOSABS 19 11/04/2019 0908    Hgb A1C Lab Results  Component Value Date   HGBA1C 4.9 09/13/2018           Assessment & Plan:   Preventative Health Maintenance:  She will get her flu shot at the hospital Tetanus UTD per her report, she will get me the actual date of this  Encouraged her to get her COVID booster Shingrix UTD Pap smear UTD Mammogram due 01/2021 Bone density UTD Colon screening UTD  Encouraged her to consume a balanced diet and exercise regimen Advised her to see an eye doctor and dentist annually We will check CBC, c-Met, lipid profile today  Mass of Left Calf:  Discussed obtaining ultrasound for further evaluation but given his stability we will hold off at this time We will continue to monitor for new or worsening symptoms  RTC in 1 year, sooner if needed Webb Silversmith, NP This visit occurred during the SARS-CoV-2 public health emergency.  Safety protocols were in place, including screening questions prior to the visit, additional usage of staff PPE, and extensive cleaning of exam room while observing appropriate contact time as indicated for disinfecting solutions.

## 2021-01-08 ENCOUNTER — Other Ambulatory Visit: Payer: Self-pay

## 2021-01-08 ENCOUNTER — Other Ambulatory Visit: Payer: Self-pay | Admitting: Internal Medicine

## 2021-01-08 MED ORDER — SAXENDA 18 MG/3ML ~~LOC~~ SOPN
PEN_INJECTOR | SUBCUTANEOUS | 0 refills | Status: DC
  Filled 2021-01-08: qty 15, 30d supply, fill #0

## 2021-01-08 NOTE — Telephone Encounter (Signed)
Requested medication (s) are due for refill today: yes  Requested medication (s) are on the active medication list:yes  Last refill: 12/05/20 66ml  0 refills  Future visit scheduled yes  03/11/21  Notes to clinic: Failed due to labs, please review. Thank you.  Requested Prescriptions  Pending Prescriptions Disp Refills   Liraglutide -Weight Management (SAXENDA) 18 MG/3ML SOPN 15 mL 0    Sig: Inject 3 mg into the skin daily.     Endocrinology:  Diabetes - GLP-1 Receptor Agonists Failed - 01/08/2021  8:41 AM      Failed - HBA1C is between 0 and 7.9 and within 180 days    Hgb A1c MFr Bld  Date Value Ref Range Status  09/13/2018 4.9 <5.7 % of total Hgb Final    Comment:    For the purpose of screening for the presence of diabetes: . <5.7%       Consistent with the absence of diabetes 5.7-6.4%    Consistent with increased risk for diabetes             (prediabetes) > or =6.5%  Consistent with diabetes . This assay result is consistent with a decreased risk of diabetes. . Currently, no consensus exists regarding use of hemoglobin A1c for diagnosis of diabetes in children. . According to American Diabetes Association (ADA) guidelines, hemoglobin A1c <7.0% represents optimal control in non-pregnant diabetic patients. Different metrics may apply to specific patient populations.  Standards of Medical Care in Diabetes(ADA). Renella Cunas - Valid encounter within last 6 months    Recent Outpatient Visits           1 month ago Encounter for general adult medical examination with abnormal findings   [facility], Coralie Keens, NP   6 months ago Abnormal weight gain   [place], NP   9 months ago Skin lesions   [facility] Kathrine Haddock, NP   1 year ago Obesity (BMI 30-39.9)   [facility], Lupita Raider, FNP   1 year ago Encounter for annual physical exam   [facility], Lupita Raider, FNP       Future Appointments             In 2 months Baity, Coralie Keens, NP [facility], [facility]

## 2021-01-29 ENCOUNTER — Encounter: Payer: Self-pay | Admitting: Internal Medicine

## 2021-01-29 DIAGNOSIS — Z1231 Encounter for screening mammogram for malignant neoplasm of breast: Secondary | ICD-10-CM

## 2021-02-08 ENCOUNTER — Telehealth: Payer: Self-pay | Admitting: Physician Assistant

## 2021-02-08 DIAGNOSIS — R058 Other specified cough: Secondary | ICD-10-CM

## 2021-02-08 MED ORDER — BENZONATATE 100 MG PO CAPS: 100.0000 mg | ORAL_CAPSULE | Freq: Three times a day (TID) | ORAL | 0 refills | Status: DC | PRN

## 2021-02-08 MED ORDER — PREDNISONE 10 MG (21) PO TBPK: ORAL_TABLET | ORAL | 0 refills | Status: DC

## 2021-02-08 NOTE — Progress Notes (Signed)
We are sorry that you are not feeling well.  Here is how we plan to help!  Based on your presentation I believe you most likely have  post-viral cough syndrome. This is a chronic cough after a moderate upper respiratory infection that is caused from residual inflammation in the airway from the previous infection.    In addition you may use A prescription cough medication called Tessalon Perles 100mg . You may take 1-2 capsules every 8 hours as needed for your cough.  Prednisone 10 mg daily for 6 days (see taper instructions below)  Directions for 6 day taper: Day 1: 2 tablets before breakfast, 1 after both lunch & dinner and 2 at bedtime Day 2: 1 tab before breakfast, 1 after both lunch & dinner and 2 at bedtime Day 3: 1 tab at each meal & 1 at bedtime Day 4: 1 tab at breakfast, 1 at lunch, 1 at bedtime Day 5: 1 tab at breakfast & 1 tab at bedtime Day 6: 1 tab at breakfast  From your responses in the eVisit questionnaire you describe inflammation in the upper respiratory tract which is causing a significant cough.  This is commonly called Bronchitis and has four common causes:   Allergies Viral Infections Acid Reflux Bacterial Infection Allergies, viruses and acid reflux are treated by controlling symptoms or eliminating the cause. An example might be a cough caused by taking certain blood pressure medications. You stop the cough by changing the medication. Another example might be a cough caused by acid reflux. Controlling the reflux helps control the cough.  USE OF BRONCHODILATOR ("RESCUE") INHALERS: There is a risk from using your bronchodilator too frequently.  The risk is that over-reliance on a medication which only relaxes the muscles surrounding the breathing tubes can reduce the effectiveness of medications prescribed to reduce swelling and congestion of the tubes themselves.  Although you feel brief relief from the bronchodilator inhaler, your asthma may actually be worsening with  the tubes becoming more swollen and filled with mucus.  This can delay other crucial treatments, such as oral steroid medications. If you need to use a bronchodilator inhaler daily, several times per day, you should discuss this with your provider.  There are probably better treatments that could be used to keep your asthma under control.     HOME CARE Only take medications as instructed by your medical team. Complete the entire course of an antibiotic. Drink plenty of fluids and get plenty of rest. Avoid close contacts especially the very young and the elderly Cover your mouth if you cough or cough into your sleeve. Always remember to wash your hands A steam or ultrasonic humidifier can help congestion.   GET HELP RIGHT AWAY IF: You develop worsening fever. You become short of breath You cough up blood. Your symptoms persist after you have completed your treatment plan MAKE SURE YOU  Understand these instructions. Will watch your condition. Will get help right away if you are not doing well or get worse.    Thank you for choosing an e-visit.  Your e-visit answers were reviewed by a board certified advanced clinical practitioner to complete your personal care plan. Depending upon the condition, your plan could have included both over the counter or prescription medications.  Please review your pharmacy choice. Make sure the pharmacy is open so you can pick up prescription now. If there is a problem, you may contact your provider through [employer] and have the prescription routed to another pharmacy.  Your safety is important to [place]. If you have drug allergies check your prescription carefully.   For the next 24 hours you can use MyChart to ask questions about today's visit, request a non-urgent call back, or ask for a work or school excuse. You will get an email in the next two days asking about your experience. I hope that your e-visit has been valuable and will speed your  recovery.  I provided 5 minutes of non face-to-face time during this encounter for chart review and documentation.

## 2021-02-28 ENCOUNTER — Ambulatory Visit
Admission: RE | Admit: 2021-02-28 | Discharge: 2021-02-28 | Disposition: A | Discharge status: Home / Self Care | Payer: No Typology Code available for payment source | Source: Ambulatory Visit | Attending: Internal Medicine | Admitting: Internal Medicine

## 2021-02-28 ENCOUNTER — Other Ambulatory Visit: Payer: Self-pay

## 2021-02-28 DIAGNOSIS — Z1231 Encounter for screening mammogram for malignant neoplasm of breast: Secondary | ICD-10-CM | POA: Diagnosis present

## 2021-03-07 ENCOUNTER — Other Ambulatory Visit: Payer: Self-pay | Admitting: Internal Medicine

## 2021-03-07 DIAGNOSIS — N6310 Unspecified lump in the right breast, unspecified quadrant: Secondary | ICD-10-CM

## 2021-03-07 DIAGNOSIS — R928 Other abnormal and inconclusive findings on diagnostic imaging of breast: Secondary | ICD-10-CM

## 2021-03-11 ENCOUNTER — Ambulatory Visit
Admission: RE | Admit: 2021-03-11 | Discharge: 2021-03-11 | Disposition: A | Discharge status: Home / Self Care | Payer: No Typology Code available for payment source | Source: Ambulatory Visit | Attending: Internal Medicine | Admitting: Internal Medicine

## 2021-03-11 ENCOUNTER — Other Ambulatory Visit: Payer: Self-pay

## 2021-03-11 ENCOUNTER — Ambulatory Visit: Payer: No Typology Code available for payment source | Admitting: Internal Medicine

## 2021-03-11 DIAGNOSIS — N6310 Unspecified lump in the right breast, unspecified quadrant: Secondary | ICD-10-CM | POA: Insufficient documentation

## 2021-03-11 DIAGNOSIS — R928 Other abnormal and inconclusive findings on diagnostic imaging of breast: Secondary | ICD-10-CM

## 2021-03-12 ENCOUNTER — Other Ambulatory Visit: Payer: Self-pay | Admitting: Internal Medicine

## 2021-03-12 DIAGNOSIS — N6310 Unspecified lump in the right breast, unspecified quadrant: Secondary | ICD-10-CM

## 2021-03-12 DIAGNOSIS — R928 Other abnormal and inconclusive findings on diagnostic imaging of breast: Secondary | ICD-10-CM

## 2021-03-22 ENCOUNTER — Ambulatory Visit
Admission: RE | Admit: 2021-03-22 | Discharge: 2021-03-22 | Disposition: A | Discharge status: Home / Self Care | Payer: No Typology Code available for payment source | Source: Ambulatory Visit | Attending: Internal Medicine | Admitting: Internal Medicine

## 2021-03-22 ENCOUNTER — Other Ambulatory Visit: Payer: Self-pay

## 2021-03-22 DIAGNOSIS — R928 Other abnormal and inconclusive findings on diagnostic imaging of breast: Secondary | ICD-10-CM | POA: Insufficient documentation

## 2021-03-22 DIAGNOSIS — N6310 Unspecified lump in the right breast, unspecified quadrant: Secondary | ICD-10-CM | POA: Diagnosis present

## 2021-03-22 DIAGNOSIS — C50919 Malignant neoplasm of unspecified site of unspecified female breast: Secondary | ICD-10-CM

## 2021-03-22 HISTORY — DX: Malignant neoplasm of unspecified site of unspecified female breast: C50.919

## 2021-03-26 ENCOUNTER — Encounter: Payer: Self-pay | Admitting: *Deleted

## 2021-03-26 DIAGNOSIS — C50911 Malignant neoplasm of unspecified site of right female breast: Secondary | ICD-10-CM

## 2021-03-26 NOTE — Progress Notes (Signed)
Notified by Electa Sniff, RN that patient had been notified of her new diagnosis of right invasive mammary carcinoma and that she was ready for navigation.  Called patient.  She would like to see Dr. Bary Castilla for surgical consultation and had no preference for med/onc. She does need to be seen after 3 or 4:00.   I have scheduled patient to see Dr. Grayland Ormond on Tuesday 2.7.23 @ 3:00 and Dr. Bary Castilla at 4:00.  Will give educational material at that time unless she prefers to pick it up prior to her appointment.

## 2021-03-26 NOTE — Progress Notes (Signed)
Received message from Electa Sniff, RN at [facility] that patient is newly diagnosed with breast cancer.  Called patient to establish navigation services. No answer.  Left message to return my call.

## 2021-03-29 ENCOUNTER — Encounter: Payer: Self-pay | Admitting: *Deleted

## 2021-04-01 ENCOUNTER — Encounter: Payer: Self-pay | Admitting: Oncology

## 2021-04-01 DIAGNOSIS — C50511 Malignant neoplasm of lower-outer quadrant of right female breast: Secondary | ICD-10-CM | POA: Insufficient documentation

## 2021-04-01 LAB — SURGICAL PATHOLOGY

## 2021-04-01 NOTE — Progress Notes (Signed)
[place]  [phone] [phone]  ID: Nolon Stalls OB: 1960-03-05  MR#: [id number]  DJT#:[id number]  Patient Care Team: Jearld Fenton, NP as PCP - General (Internal Medicine) Lloyd Huger, MD as Consulting Physician (Oncology) Bary Castilla Forest Gleason, MD as Consulting Physician (General Surgery)  CHIEF COMPLAINT: Stage Ia ER/PR positive, HER-2 negative invasive carcinoma of the lower outer quadrant of the right breast.  INTERVAL HISTORY: Patient is a 61 year old female who was noted to have an abnormality on routine screening mammogram.  Subsequent ultrasound biopsy revealed the above-stated malignancy.  She is anxious, but otherwise feels well.  She has no neurologic complaints.  She denies any recent fevers or illnesses.  She has a good appetite and denies weight loss.  She has no chest pain, shortness of breath, cough, or hemoptysis.  She denies any nausea, vomiting, constipation, or diarrhea.  She has no urinary complaints.  Patient otherwise feels well and offers no further specific complaints today.  REVIEW OF SYSTEMS:   Review of Systems  Constitutional: Negative.  Negative for fever, malaise/fatigue and weight loss.  Respiratory: Negative.  Negative for cough, hemoptysis and shortness of breath.   Cardiovascular: Negative.  Negative for chest pain and leg swelling.  Gastrointestinal: Negative.  Negative for abdominal pain.  Genitourinary: Negative.  Negative for dysuria.  Musculoskeletal: Negative.  Negative for back pain.  Skin: Negative.  Negative for rash.  Neurological: Negative.  Negative for dizziness, focal weakness, weakness and headaches.  Psychiatric/Behavioral:  The patient is nervous/anxious.    As per HPI. Otherwise, a complete review of systems is negative.  PAST MEDICAL HISTORY: Past Medical History:  Diagnosis Date   Adenomatous polyp 2019   Anxiety    Breast cancer ([place]) 03/22/2021   Chicken pox     Depression     PAST SURGICAL HISTORY: Past Surgical History:  Procedure Laterality Date   BREAST BIOPSY Left 2010   biopsy of a lump   CESAREAN SECTION  1989   COLONOSCOPY  2019   Adenomatous Polyp: [facility] 12/2017: Recall ltr mailed 12/11/17   TUBAL LIGATION  2000    FAMILY HISTORY: Family History  Problem Relation Age of Onset   Hyperlipidemia Mother    Alzheimer's disease Mother    Hypertension Father    Heart disease Father    Lung cancer Paternal Grandfather    Melanoma Brother    Breast cancer Neg Hx    Stroke Neg Hx     ADVANCED DIRECTIVES (Y/N):  N  HEALTH MAINTENANCE: Social History   Tobacco Use   Smoking status: Former    Packs/day: 0.50    Years: 30.00    Pack years: 15.00    Types: Cigarettes    Quit date: 04/10/2014    Years since quitting: 6.9   Smokeless tobacco: Never  Vaping Use   Vaping Use: Never used  Substance Use Topics   Alcohol use: Yes    Alcohol/week: 1.0 standard drink    Types: 1 Glasses of wine per week    Comment: on special occassions   Drug use: No     Colonoscopy:  PAP:  Bone density:  Lipid panel:  No Known Allergies  Current Outpatient Medications  Medication Sig Dispense Refill   doxylamine, Sleep, (UNISOM) 25 MG tablet Take 25 mg by mouth at bedtime as needed.     omeprazole (PRILOSEC) 20 MG capsule Take 20 mg by mouth daily.     lidocaine-prilocaine (EMLA) cream Apply to areola  one hour prior to arrival day of procedure. Cover with saran wrap. 30 g 0   No current facility-administered medications for this visit.    OBJECTIVE: Vitals:   04/02/21 1457  BP: (!) 141/72  Pulse: 81  Resp: 16  Temp: (!) 96.9 F (36.1 C)  SpO2: 100%     Body mass index is 24.21 kg/m.    ECOG FS:0 - Asymptomatic  General: Well-developed, well-nourished, no acute distress. Eyes: Pink conjunctiva, anicteric sclera. HEENT: Normocephalic, moist mucous membranes. Breast: Exam deferred today. Lungs: No audible wheezing or  coughing. Heart: Regular rate and rhythm. Abdomen: Soft, nontender, no obvious distention. Musculoskeletal: No edema, cyanosis, or clubbing. Neuro: Alert, answering all questions appropriately. Cranial nerves grossly intact. Skin: No rashes or petechiae noted. Psych: Normal affect. Lymphatics: No cervical, calvicular, axillary or inguinal LAD.   LAB RESULTS:  Lab Results  Component Value Date   NA 139 12/05/2020   K 4.5 12/05/2020   CL 104 12/05/2020   CO2 28 12/05/2020   GLUCOSE 85 12/05/2020   BUN 15 12/05/2020   CREATININE 0.83 12/05/2020   CALCIUM 9.5 12/05/2020   PROT 6.9 12/05/2020   ALBUMIN 4.3 10/17/2015   AST 14 12/05/2020   ALT 14 12/05/2020   ALKPHOS 38 (L) 10/17/2015   BILITOT 1.1 12/05/2020   GFRNONAA 78 11/04/2019   GFRAA 90 11/04/2019    Lab Results  Component Value Date   WBC 5.2 12/05/2020   NEUTROABS 3,633 11/04/2019   HGB 14.0 12/05/2020   HCT 41.7 12/05/2020   MCV 91.6 12/05/2020   PLT 291 12/05/2020     STUDIES: US BREAST LTD UNI RIGHT INC AXILLA  Result Date: 03/11/2021 CLINICAL DATA:  Patient was recalled from screening mammogram for a right breast mass. EXAM: DIGITAL DIAGNOSTIC UNILATERAL RIGHT MAMMOGRAM WITH TOMOSYNTHESIS AND CAD; ULTRASOUND RIGHT BREAST LIMITED TECHNIQUE: Right digital diagnostic mammography and breast tomosynthesis was performed. The images were evaluated with computer-aided detection.; Targeted ultrasound examination of the right breast was performed COMPARISON:  Previous exam(s). ACR Breast Density Category c: The breast tissue is heterogeneously dense, which may obscure small masses. FINDINGS: Additional imaging of the right breast was performed. There is persistence of a spiculated mass in the lower outer quadrant of the breast. There are no malignant type microcalcifications. On physical exam, I palpate a discrete mass in the right breast at 8 o'clock 3 cm from the nipple. Targeted ultrasound is performed, showing an  irregular hypoechoic mass in the right breast at 8 o'clock 3 cm from the nipple measuring 6 x 5 x 6 mm. Sonographic evaluation the right axilla does not show any enlarged adenopathy. IMPRESSION: Suspicious mass in the 8 o'clock region of the right breast. RECOMMENDATION: Ultrasound-guided core biopsy of the mass in the 8 o'clock region of the right breast is recommended. I have discussed the findings and recommendations with the patient. If applicable, a reminder letter will be sent to the patient regarding the next appointment. BI-RADS CATEGORY  5: Highly suggestive of malignancy. Electronically Signed   By: Lillia Mountain M.D.   On: 03/11/2021 15:56  MM DIAG BREAST TOMO UNI RIGHT  Result Date: 03/11/2021 CLINICAL DATA:  Patient was recalled from screening mammogram for a right breast mass. EXAM: DIGITAL DIAGNOSTIC UNILATERAL RIGHT MAMMOGRAM WITH TOMOSYNTHESIS AND CAD; ULTRASOUND RIGHT BREAST LIMITED TECHNIQUE: Right digital diagnostic mammography and breast tomosynthesis was performed. The images were evaluated with computer-aided detection.; Targeted ultrasound examination of the right breast was performed COMPARISON:  Previous exam(s). ACR  Breast Density Category c: The breast tissue is heterogeneously dense, which may obscure small masses. FINDINGS: Additional imaging of the right breast was performed. There is persistence of a spiculated mass in the lower outer quadrant of the breast. There are no malignant type microcalcifications. On physical exam, I palpate a discrete mass in the right breast at 8 o'clock 3 cm from the nipple. Targeted ultrasound is performed, showing an irregular hypoechoic mass in the right breast at 8 o'clock 3 cm from the nipple measuring 6 x 5 x 6 mm. Sonographic evaluation the right axilla does not show any enlarged adenopathy. IMPRESSION: Suspicious mass in the 8 o'clock region of the right breast. RECOMMENDATION: Ultrasound-guided core biopsy of the mass in the 8 o'clock region of  the right breast is recommended. I have discussed the findings and recommendations with the patient. If applicable, a reminder letter will be sent to the patient regarding the next appointment. BI-RADS CATEGORY  5: Highly suggestive of malignancy. Electronically Signed   By: Lillia Mountain M.D.   On: 03/11/2021 15:56  MM CLIP PLACEMENT RIGHT  Result Date: 03/22/2021 CLINICAL DATA:  Status post ultrasound-guided biopsy EXAM: 3D DIAGNOSTIC RIGHT MAMMOGRAM POST ULTRASOUND BIOPSY COMPARISON:  Previous exam(s). FINDINGS: 3D Mammographic images were obtained following ultrasound guided biopsy of a mass at 8 o'clock. The COIL biopsy marking clip is in expected position at the site of biopsy. IMPRESSION: Appropriate positioning of the COIL shaped biopsy marking clip at the site of biopsy in the RIGHT lower outer breast. Final Assessment: Post Procedure Mammograms for Marker Placement Electronically Signed   By: Valentino Saxon M.D.   On: 03/22/2021 09:59  [place] RT BREAST BX W LOC DEV 1ST LESION IMG BX SPEC US GUIDE  Addendum Date: 03/28/2021   ADDENDUM REPORT: 03/28/2021 12:57 ADDENDUM: PATHOLOGY revealed: A. RIGHT BREAST, 8:00 3 CMFN; ULTRASOUND-GUIDED BIOPSY: - INVASIVE MAMMARY CARCINOMA, NO SPECIAL TYPE. Size of invasive carcinoma: 6 mm in this sample. Grade 2. Ductal carcinoma in situ: Not identified. Lymphovascular invasion: Not identified. Pathology results are CONCORDANT with imaging findings, per Dr. Valentino Saxon. Pathology results and recommendations were discussed with patient via telephone on 03/25/2021. Patient reported biopsy site doing well with no adverse symptoms, and only slight tenderness at the site. Post biopsy care instructions were reviewed, questions were answered and my direct phone number was provided. Patient was instructed to call [facility] for any additional questions or concerns related to biopsy site. RECOMMENDATIONS: 1. Surgical consultation. Request for surgical  consultation relayed to Al Pimple RN and Tanya Nones RN at [facility] by Electa Sniff RN on 03/25/2021. 2. Consider bilateral breast MRI with and without contrast given patient's heterogenous breast density. Pathology results reported by Electa Sniff RN on 03/28/2021. Electronically Signed   By: Valentino Saxon M.D.   On: 03/28/2021 12:57   Result Date: 03/28/2021 CLINICAL DATA:  Indeterminate RIGHT breast mass EXAM: ULTRASOUND GUIDED RIGHT BREAST CORE NEEDLE BIOPSY COMPARISON:  Previous exam(s). PROCEDURE: I met with the patient and we discussed the procedure of ultrasound-guided biopsy, including benefits and alternatives. We discussed the high likelihood of a successful procedure. We discussed the risks of the procedure, including infection, bleeding, tissue injury, clip migration, and inadequate sampling. Informed written consent was given. The usual time-out protocol was performed immediately prior to the procedure. Lesion quadrant: Lower outer quadrant Using sterile technique and 1% lidocaine and 1% lidocaine with epinephrine as local anesthetic, under direct ultrasound visualization, a 14 gauge spring-loaded device was  used to perform biopsy of a mass at 8 o'clock using an inferolateral approach. At the conclusion of the procedure COIL tissue marker clip was deployed into the biopsy cavity. Follow up 2 view mammogram was performed and dictated separately. IMPRESSION: Ultrasound guided biopsy of a mass at 8 o'clock. No apparent complications. Electronically Signed: By: Valentino Saxon M.D. On: 03/22/2021 10:04    ASSESSMENT: Stage Ia ER/PR positive, HER-2 negative invasive carcinoma of the lower outer quadrant of the right breast.  PLAN:    Stage Ia ER/PR positive, HER-2 negative invasive carcinoma of the lower outer quadrant of the right breast: Patient has an appointment with surgery later this afternoon and plans to undergo lumpectomy as her first course of treatment.  It is  unlikely she will require chemotherapy given the size of her malignancy and stage of disease, but can consider Oncotype testing for completeness.  After her lumpectomy she will benefit from adjuvant XRT.  She will also benefit from an aromatase inhibitor for 5 years at the completion of all her treatments.  No further intervention is needed.  Return to clinic 1 to 2 weeks after surgery to discuss her final pathology results and additional treatment planning.  I spent a total of 60 minutes reviewing chart data, face-to-face evaluation with the patient, counseling and coordination of care as detailed above.   Patient expressed understanding and was in agreement with this plan. She also understands that She can call clinic at any time with any questions, concerns, or complaints.    Cancer Staging  Carcinoma of lower outer quadrant of right breast [facility]) Staging form: Breast, AJCC 8th Edition - Clinical stage from 04/01/2021: Stage IA (cT1b, cN0, cM0, G2, ER+, PR+, HER2-) - Signed by Lloyd Huger, MD on 04/03/2021 Stage prefix: Initial diagnosis Histologic grading system: 3 grade system   Lloyd Huger, MD   04/03/2021 4:07 PM

## 2021-04-02 ENCOUNTER — Other Ambulatory Visit: Payer: Self-pay

## 2021-04-02 ENCOUNTER — Encounter: Payer: Self-pay | Admitting: Oncology

## 2021-04-02 ENCOUNTER — Encounter: Payer: Self-pay | Admitting: Internal Medicine

## 2021-04-02 ENCOUNTER — Inpatient Hospital Stay: Payer: No Typology Code available for payment source

## 2021-04-02 ENCOUNTER — Inpatient Hospital Stay: Payer: No Typology Code available for payment source | Attending: Oncology | Admitting: Oncology

## 2021-04-02 DIAGNOSIS — C50511 Malignant neoplasm of lower-outer quadrant of right female breast: Secondary | ICD-10-CM | POA: Insufficient documentation

## 2021-04-02 DIAGNOSIS — Z801 Family history of malignant neoplasm of trachea, bronchus and lung: Secondary | ICD-10-CM | POA: Insufficient documentation

## 2021-04-02 DIAGNOSIS — Z808 Family history of malignant neoplasm of other organs or systems: Secondary | ICD-10-CM | POA: Insufficient documentation

## 2021-04-02 DIAGNOSIS — Z170 Estrogen receptor positive status [ER+]: Secondary | ICD-10-CM | POA: Insufficient documentation

## 2021-04-02 MED ORDER — LIDOCAINE-PRILOCAINE 2.5-2.5 % EX CREA
TOPICAL_CREAM | CUTANEOUS | 0 refills | Status: DC
  Filled 2021-04-02: qty 30, 10d supply, fill #0

## 2021-04-03 ENCOUNTER — Encounter: Payer: Self-pay | Admitting: *Deleted

## 2021-04-03 ENCOUNTER — Other Ambulatory Visit: Payer: Self-pay

## 2021-04-03 ENCOUNTER — Other Ambulatory Visit: Payer: Self-pay | Admitting: Emergency Medicine

## 2021-04-03 DIAGNOSIS — C50511 Malignant neoplasm of lower-outer quadrant of right female breast: Secondary | ICD-10-CM

## 2021-04-03 NOTE — Progress Notes (Signed)
Called patient to follow up after her initial medical oncology consult with Dr. Grayland Ormond yesterday.  Gave patient breast cancer educational literature, "My Breast Cancer Treatment Handbook" by Josephine Igo, RN yesterday at her visit.  She states Dr. Grayland Ormond did not have her ER/PR and Her2 results yesterday.  I was able to review those results with her today.  That she is ER/PR positive and Her2 negative.  Patient is scheduled for surgery on 04/10/21.  Will notify Dr. Gary Fleet team to schedule her follow up appointment.

## 2021-04-04 ENCOUNTER — Other Ambulatory Visit: Payer: Self-pay | Admitting: General Surgery

## 2021-04-04 DIAGNOSIS — Z170 Estrogen receptor positive status [ER+]: Secondary | ICD-10-CM

## 2021-04-04 DIAGNOSIS — C50511 Malignant neoplasm of lower-outer quadrant of right female breast: Secondary | ICD-10-CM

## 2021-04-04 NOTE — Progress Notes (Signed)
Subjective:     Patient ID: Heather Jensen is a 61 y.o. female.   HPI   The following portions of the patient's history were reviewed and updated as appropriate.   This a new patient is here today for: office visit. Here for evaluation of newly diagnosis of right breast cancer referred by Webb Silversmith NP. She states she could not feel anything prior to the breast biopsy.   She did see Dr Grayland Ormond this afternoon. 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   Review of Systems  Constitutional: Negative for chills and fever.  Respiratory: Negative for cough.          Chief Complaint  Patient presents with   New Patient      BP 130/82    Pulse 94    Temp 37.1 C (98.7 F)    Ht 168.9 cm (5' 6.5")    Wt 68.5 kg (151 lb)    LMP 09/07/2013    SpO2 98%    BMI 24.01 kg/m        Past Medical History:  Diagnosis Date   Anxiety     Breast cancer ([facility]) 03/22/2021   Depression             Past Surgical History:  Procedure Laterality Date   CESAREAN SECTION   1989    preterm - 29 wks   TUBAL LIGATION   2000   BREAST EXCISIONAL BIOPSY Left 2010   COLONOSCOPY   01/04/2013    Adenomatous Polyp: CBF 12/2017: Recall ltr mailed 12/11/17   ultrasound guided core breast biopsy Right 03/22/2021                OB History     Gravida  1   Para  1   Term      Preterm  1   AB      Living  1      SAB      IAB      Ectopic      Molar      Multiple      Live Births           Obstetric Comments  Age at first period 28 Age of first pregnancy              Social History           Socioeconomic History   Marital status: Unknown  Tobacco Use   Smoking status: Former      Packs/day: 0.50      Years: 30.00      Pack years: 15.00      Types: Cigarettes      Quit date: 04/10/2014      Years since quitting: 6.9   Smokeless tobacco: Never  Substance and Sexual Activity   Alcohol use: Yes      Comment: occasional   Drug use: No   Sexual activity: Never       Birth control/protection: Surgical        No Known Allergies   Current Medications        Current Outpatient Medications  Medication Sig Dispense Refill   doxylamine succinate (UNISOM) 25 mg tablet Take by mouth at bedtime       omeprazole (PRILOSEC) 20 MG DR capsule Take 20 mg by mouth once daily        No current facility-administered medications for this visit.  Family History  Problem Relation Age of Onset   Hyperlipidemia (Elevated cholesterol) Mother     Alzheimer's disease Mother     High blood pressure (Hypertension) Father     Heart disease Father     Melanoma Brother     Lung cancer Paternal Grandfather     Breast cancer Neg Hx     Colon cancer Neg Hx          Labs and Radiology:    Imaging review:   Mammograms and ultrasound from January 31, 2020 through March 22, 2021 were independently reviewed.  New lesion in the lower inner quadrant of the right breast.     Pathology review March 22, 2021:   A. RIGHT BREAST, 8:00 3CMFN; ULTRASOUND-GUIDED BIOPSY:  - INVASIVE MAMMARY CARCINOMA, NO SPECIAL TYPE.   Size of invasive carcinoma: 6 mm in this sample  Histologic grade of invasive carcinoma: Grade 2                       Glandular/tubular differentiation score: 3                       Nuclear pleomorphism score: 2                       Mitotic rate score: 1                       Total score: 6  Ductal carcinoma in situ: Not identified  Lymphovascular invasion: Not identified   Estrogen Receptor (ER) Status: POSITIVE          Percentage of cells with nuclear positivity: 91-100          Average intensity of staining: Strong   Progesterone Receptor (PgR) Status: POSITIVE          Percentage of cells with nuclear positivity: 51-60          Average intensity of staining: Strong   HER2 (by immunohistochemistry): NEGATIVE (Score 1+)  Ki-67: Not performed   Right breast ultrasound April 02, 2021:   The study was completed to determine if  preoperative wire localization will be required.  Examination of the lower outer quadrant of the right breast, 5 cm from the nipple shows a 0.4 x 0.6 x 0.71 hypoechoic mass with dense acoustic shadowing consistent with the prior biopsy site.  BI-RADS-6.   Medical oncology note of April 02, 2021:   Reviewed.    Objective:   Physical Exam Exam conducted with a chaperone present.  Constitutional:      Appearance: Normal appearance.  Cardiovascular:     Rate and Rhythm: Normal rate and regular rhythm.     Pulses: Normal pulses.     Heart sounds: Normal heart sounds.  Pulmonary:     Effort: Pulmonary effort is normal.     Breath sounds: Normal breath sounds.  Chest:  Breasts:    Right: Mass: thickening at prior biopsy site.     Left: Normal.       Comments: Minimal bruising from recent biopsy. Musculoskeletal:     Cervical back: Neck supple.  Lymphadenopathy:     Upper Body:     Right upper body: No supraclavicular or axillary adenopathy.     Left upper body: No supraclavicular or axillary adenopathy.  Skin:    General: Skin is warm and dry.  Neurological:     Mental Status: She is alert  and oriented to person, place, and time.  Psychiatric:        Mood and Affect: Mood normal.        Behavior: Behavior normal.           Assessment:     Early stage right breast cancer.     Plan:     Options for management were reviewed including 1) mastectomy with or without breast reconstruction versus 2) lumpectomy with postoperative radiation therapy.  Pros and cons of each were reviewed.  Informational brochure and website addresses provided.   Opportunity for second surgical opinion reviewed.   At this time the patient is desirous of breast conservation.  This will be scheduled a convenient date.   She was instructed in regards to the use of Emla cream prior to the sentinel node injection by the staff.   Approximately an hour was spent with the patient.      This note is  partially prepared by Karie Fetch, RN, acting as a scribe in the presence of Dr. Hervey Ard, MD.  The documentation recorded by the scribe accurately reflects the service I personally performed and the decisions made by me.    Robert Bellow, MD FACS

## 2021-04-04 NOTE — Telephone Encounter (Signed)
Can you add this patient on for a virtual visit tomorrow at 1245?

## 2021-04-05 ENCOUNTER — Encounter: Payer: Self-pay | Admitting: Internal Medicine

## 2021-04-05 ENCOUNTER — Ambulatory Visit (INDEPENDENT_AMBULATORY_CARE_PROVIDER_SITE_OTHER): Payer: No Typology Code available for payment source | Admitting: Internal Medicine

## 2021-04-05 DIAGNOSIS — C50511 Malignant neoplasm of lower-outer quadrant of right female breast: Secondary | ICD-10-CM | POA: Diagnosis not present

## 2021-04-05 DIAGNOSIS — F5104 Psychophysiologic insomnia: Secondary | ICD-10-CM | POA: Diagnosis not present

## 2021-04-05 DIAGNOSIS — F418 Other specified anxiety disorders: Secondary | ICD-10-CM | POA: Diagnosis not present

## 2021-04-05 MED ORDER — ZOLPIDEM TARTRATE 5 MG PO TABS: 5.0000 mg | ORAL_TABLET | Freq: Every evening | ORAL | 0 refills | Status: DC | PRN

## 2021-04-05 NOTE — Patient Instructions (Signed)

## 2021-04-05 NOTE — Progress Notes (Signed)
Virtual Visit via Video Note  I connected with Heather Jensen on 04/05/21 at 12:45 PM EST by a video enabled telemedicine application and verified that I am speaking with the correct person using two identifiers.  Location: Patient: Work Provider: Buyer, retail in this telephone call: Webb Silversmith NP and Angelique Holm.   I discussed the limitations of evaluation and management by telemedicine and the availability of in person appointments. The patient expressed understanding and agreed to proceed.  History of Present Illness:  Pt reports anxiety and difficulty sleeping. This has been an issue since her recent breast cancer diagnosis 04/02/21. She reports she does fine while she is at work, it is like a distraction for her. She starts getting anxious in the afternoons and has trouble falling asleep and staying asleep because she can not turn her mind off. She has been taking Unisom with minimal relief of symptoms. She is not currently seeing a therapist. She denies depression, has had situation anxiety, depression and insomnia in the past while going through a divorce. She was treated with Paroxetine and Ambien at that time. She reports her lumpectomy is scheduled for Wednesday 04/10/21. She feels like once she gets the results from this and has a clearer treatment plan, that she may not feel as anxious.   Past Medical History:  Diagnosis Date   Adenomatous polyp 2019   Anxiety    Breast cancer ([place]) 03/22/2021   Chicken pox    Depression     Current Outpatient Medications  Medication Sig Dispense Refill   doxylamine, Sleep, (UNISOM) 25 MG tablet Take 25 mg by mouth at bedtime.     lidocaine-prilocaine (EMLA) cream Apply to areola one hour prior to arrival day of procedure. Cover with saran wrap. 30 g 0   omeprazole (PRILOSEC) 20 MG capsule Take 20 mg by mouth daily.     zolpidem (AMBIEN) 5 MG tablet Take 1 tablet (5 mg total) by mouth at bedtime as needed for  sleep. 30 tablet 0   No current facility-administered medications for this visit.    No Known Allergies  Family History  Problem Relation Age of Onset   Hyperlipidemia Mother    Alzheimer's disease Mother    Hypertension Father    Heart disease Father    Lung cancer Paternal Grandfather    Melanoma Brother    Breast cancer Neg Hx    Stroke Neg Hx     Social History   Socioeconomic History   Marital status: Married    Spouse name: Not on file   Number of children: 1   Years of education: Not on file   Highest education level: Bachelor's degree (e.g., BA, AB, BS)  Occupational History   Not on file  Tobacco Use   Smoking status: Former    Packs/day: 0.50    Years: 30.00    Pack years: 15.00    Types: Cigarettes    Quit date: 04/10/2014    Years since quitting: 6.9   Smokeless tobacco: Never  Vaping Use   Vaping Use: Never used  Substance and Sexual Activity   Alcohol use: Yes    Alcohol/week: 1.0 standard drink    Types: 1 Glasses of wine per week    Comment: on special occassions   Drug use: No   Sexual activity: Yes    Partners: Male    Birth control/protection: None  Other Topics Concern   Not on file  Social History Narrative   Not  on file   Social Determinants of Health   Financial Resource Strain: Not on file  Food Insecurity: Not on file  Transportation Needs: Not on file  Physical Activity: Not on file  Stress: Not on file  Social Connections: Not on file  Intimate Partner Violence: Not on file     Constitutional: Denies fever, malaise, fatigue, headache or abrupt weight changes.  Respiratory: Denies difficulty breathing, shortness of breath, cough or sputum production.   Skin: Pt reports mass of right breast. Denies redness, rashes, or ulcercations.  Neurological: Pt reports insomnia. Denies dizziness, difficulty with memory, difficulty with speech or problems with balance and coordination.  Psych: Pt reports anxiety. Denies  depression,  SI/HI.  No other specific complaints in a complete review of systems (except as listed in HPI above).    Observations/Objective:  Wt Readings from Last 3 Encounters:  04/02/21 150 lb (68 kg)  12/05/20 151 lb (68.5 kg)  07/06/20 149 lb 3.2 oz (67.7 kg)    General: In NAD. Pulmonary/Chest: Normal effort. No respiratory distress.  Neurological: Alert and oriented. Cranial nerves II-XII grossly intact. Coordination normal.  Psychiatric: Mood and affect normal. Mildly anxious sounding. Judgment and thought content normal.     BMET    Component Value Date/Time   NA 139 12/05/2020 0828   K 4.5 12/05/2020 0828   CL 104 12/05/2020 0828   CO2 28 12/05/2020 0828   GLUCOSE 85 12/05/2020 0828   BUN 15 12/05/2020 0828   CREATININE 0.83 12/05/2020 0828   CALCIUM 9.5 12/05/2020 0828   GFRNONAA 78 11/04/2019 0908   GFRAA 90 11/04/2019 0908    Lipid Panel     Component Value Date/Time   CHOL 181 12/05/2020 0828   TRIG 68 12/05/2020 0828   HDL 69 12/05/2020 0828   CHOLHDL 2.6 12/05/2020 0828   VLDL 20.8 10/17/2015 1412   LDLCALC 97 12/05/2020 0828    CBC    Component Value Date/Time   WBC 5.2 12/05/2020 0828   RBC 4.55 12/05/2020 0828   HGB 14.0 12/05/2020 0828   HCT 41.7 12/05/2020 0828   PLT 291 12/05/2020 0828   MCV 91.6 12/05/2020 0828   MCH 30.8 12/05/2020 0828   MCHC 33.6 12/05/2020 0828   RDW 12.2 12/05/2020 0828   LYMPHSABS 1,786 11/04/2019 0908   EOSABS 130 11/04/2019 0908   BASOSABS 19 11/04/2019 0908    Hgb A1C Lab Results  Component Value Date   HGBA1C 4.9 09/13/2018       Assessment and Plan:  Anxiety, Insomnia, secondary to Right Breast Cancer:  Will restart Ambien 5 mg daily She does not feel like she needs anything daily for anxiety at this point Support offered We will plan to regroup after results from lumpectomy are back or if anxiety worsens in the meantime  Return precautions discussed Follow Up Instructions:    I discussed the  assessment and treatment plan with the patient. The patient was provided an opportunity to ask questions and all were answered. The patient agreed with the plan and demonstrated an understanding of the instructions.   The patient was advised to call back or seek an in-person evaluation if the symptoms worsen or if the condition fails to improve as anticipated.  I provided 11:30 minutes of non-face-to-face time during this encounter.   Webb Silversmith, NP

## 2021-04-08 ENCOUNTER — Other Ambulatory Visit: Payer: Self-pay | Admitting: General Surgery

## 2021-04-08 DIAGNOSIS — C50511 Malignant neoplasm of lower-outer quadrant of right female breast: Secondary | ICD-10-CM

## 2021-04-09 ENCOUNTER — Encounter
Admission: RE | Admit: 2021-04-09 | Discharge: 2021-04-09 | Disposition: A | Discharge status: Home / Self Care | Payer: No Typology Code available for payment source | Source: Ambulatory Visit | Attending: General Surgery | Admitting: General Surgery

## 2021-04-09 ENCOUNTER — Other Ambulatory Visit: Payer: Self-pay

## 2021-04-09 HISTORY — DX: Gastro-esophageal reflux disease without esophagitis: K21.9

## 2021-04-09 NOTE — Patient Instructions (Signed)
Your procedure is scheduled on: 04/10/21 Report to [place]. To find out your arrival time please call [phone] between 1PM - 3PM on 04/09/21.  Remember: Instructions that are not followed completely may result in serious medical risk, up to and including death, or upon the discretion of your surgeon and anesthesiologist your surgery may need to be rescheduled.     _X__ 1. Do not eat food after midnight the night before your procedure.                 No gum chewing or hard candies. You may drink clear liquids up to 2 hours                 before you are scheduled to arrive for your surgery- DO not drink clear                 liquids within 2 hours of the start of your surgery.                 Clear Liquids include:  water, apple juice without pulp, clear carbohydrate                 drink such as Clearfast or Gatorade, Black Coffee or Tea (Do not add                 anything to coffee or tea). Diabetics water only  __X__2.  On the morning of surgery brush your teeth with toothpaste and water, you                 may rinse your mouth with mouthwash if you wish.  Do not swallow any              toothpaste of mouthwash.     _X__ 3.  No Alcohol for 24 hours before or after surgery.   _X__ 4.  Do Not Smoke or use e-cigarettes For 24 Hours Prior to Your Surgery.                 Do not use any chewable tobacco products for at least 6 hours prior to                 surgery.  ____  5.  Bring all medications with you on the day of surgery if instructed.   __X__  6.  Notify your doctor if there is any change in your medical condition      (cold, fever, infections).     Do not wear jewelry, make-up, hairpins, clips or nail polish. Do not wear lotions, powders, or perfumes.  Do not shave 48 hours prior to surgery. Men may shave face and neck. Do not bring valuables to the hospital.    [facility] is not responsible for any belongings or  valuables.  Contacts, dentures/partials or body piercings may not be worn into surgery. Bring a case for your contacts, glasses or hearing aids, a denture cup will be supplied. Leave your suitcase in the car. After surgery it may be brought to your room. For patients admitted to the hospital, discharge time is determined by your treatment team.   Patients discharged the day of surgery will not be allowed to drive home.   Please read over the following fact sheets that you were given:   CHG soap  __X__ Take these medicines the morning of surgery with A SIP OF WATER:  1. omeprazole (PRILOSEC) 20 MG capsule  2.   3.   4.  5.  6.  ____ Fleet Enema (as directed)   __X__ Use CHG Soap/SAGE wipes as directed  ____ Use inhalers on the day of surgery  ____ Stop metformin/Janumet/Farxiga 2 days prior to surgery    ____ Take 1/2 of usual insulin dose the night before surgery. No insulin the morning          of surgery.   ____ Stop Blood Thinners Coumadin/Plavix/Xarelto/Pleta/Pradaxa/Eliquis/Effient/Aspirin  on   Or contact your Surgeon, Cardiologist or Medical Doctor regarding  ability to stop your blood thinners  __X__ Stop Anti-inflammatories 7 days before surgery such as Advil, Ibuprofen, Motrin,  BC or Goodies Powder, Naprosyn, Naproxen, Aleve, Aspirin    __X__ Stop all herbals and supplements, fish oil or vitamins  until after surgery.    ____ Bring C-Pap to the hospital.

## 2021-04-10 ENCOUNTER — Ambulatory Visit: Payer: No Typology Code available for payment source | Admitting: Anesthesiology

## 2021-04-10 ENCOUNTER — Ambulatory Visit
Admission: RE | Admit: 2021-04-10 | Discharge: 2021-04-10 | Disposition: A | Discharge status: Home / Self Care | Payer: No Typology Code available for payment source | Source: Ambulatory Visit | Attending: General Surgery | Admitting: General Surgery

## 2021-04-10 ENCOUNTER — Encounter
Admission: RE | Disposition: A | Discharge status: Home / Self Care | Payer: Self-pay | Source: Home / Self Care | Attending: General Surgery

## 2021-04-10 ENCOUNTER — Other Ambulatory Visit: Payer: Self-pay

## 2021-04-10 ENCOUNTER — Ambulatory Visit
Admission: RE | Admit: 2021-04-10 | Discharge: 2021-04-10 | Disposition: A | Discharge status: Home / Self Care | Payer: No Typology Code available for payment source | Attending: General Surgery | Admitting: General Surgery

## 2021-04-10 ENCOUNTER — Encounter: Payer: Self-pay | Admitting: General Surgery

## 2021-04-10 DIAGNOSIS — K219 Gastro-esophageal reflux disease without esophagitis: Secondary | ICD-10-CM | POA: Insufficient documentation

## 2021-04-10 DIAGNOSIS — C50511 Malignant neoplasm of lower-outer quadrant of right female breast: Secondary | ICD-10-CM | POA: Insufficient documentation

## 2021-04-10 DIAGNOSIS — Z87891 Personal history of nicotine dependence: Secondary | ICD-10-CM | POA: Diagnosis not present

## 2021-04-10 HISTORY — PX: BREAST LUMPECTOMY WITH SENTINEL LYMPH NODE BIOPSY: SHX5597

## 2021-04-10 SURGERY — BREAST LUMPECTOMY WITH SENTINEL LYMPH NODE BX
Anesthesia: General | Site: Breast | Laterality: Right

## 2021-04-10 MED ORDER — FENTANYL CITRATE (PF) 100 MCG/2ML IJ SOLN
INTRAMUSCULAR | Status: AC
  Filled 2021-04-10: qty 2

## 2021-04-10 MED ORDER — OXYCODONE HCL 5 MG PO TABS
ORAL_TABLET | ORAL | Status: AC
  Filled 2021-04-10: qty 1

## 2021-04-10 MED ORDER — BUPIVACAINE-EPINEPHRINE (PF) 0.5% -1:200000 IJ SOLN
INTRAMUSCULAR | Status: DC | PRN
  Administered 2021-04-10: 30 mL

## 2021-04-10 MED ORDER — KETAMINE HCL 50 MG/5ML IJ SOSY
PREFILLED_SYRINGE | INTRAMUSCULAR | Status: AC
  Filled 2021-04-10: qty 5

## 2021-04-10 MED ORDER — ACETAMINOPHEN 10 MG/ML IV SOLN: 1000.0000 mg | Freq: Once | INTRAVENOUS | Status: DC | PRN

## 2021-04-10 MED ORDER — ACETAMINOPHEN 10 MG/ML IV SOLN
INTRAVENOUS | Status: DC | PRN
  Administered 2021-04-10: 1000 mg via INTRAVENOUS

## 2021-04-10 MED ORDER — STERILE WATER FOR IRRIGATION IR SOLN
Status: DC | PRN
  Administered 2021-04-10: 1000 mL

## 2021-04-10 MED ORDER — KETAMINE HCL 50 MG/ML IJ SOLN
INTRAMUSCULAR | Status: DC | PRN
  Administered 2021-04-10: 10 mg via INTRAMUSCULAR
  Administered 2021-04-10 (×2): 20 mg via INTRAMUSCULAR

## 2021-04-10 MED ORDER — LACTATED RINGERS IV SOLN: INTRAVENOUS | Status: DC

## 2021-04-10 MED ORDER — PROPOFOL 500 MG/50ML IV EMUL
INTRAVENOUS | Status: DC | PRN
Start: 2021-04-10 — End: 2021-04-10

## 2021-04-10 MED ORDER — TECHNETIUM TC 99M TILMANOCEPT KIT
1.0500 | PACK | Freq: Once | INTRAVENOUS | Status: AC | PRN
  Administered 2021-04-10: 1.05 via INTRADERMAL

## 2021-04-10 MED ORDER — KETOROLAC TROMETHAMINE 30 MG/ML IJ SOLN
INTRAMUSCULAR | Status: DC | PRN
  Administered 2021-04-10: 30 mg via INTRAVENOUS

## 2021-04-10 MED ORDER — PROPOFOL 500 MG/50ML IV EMUL
INTRAVENOUS | Status: AC
  Filled 2021-04-10: qty 50

## 2021-04-10 MED ORDER — FENTANYL CITRATE (PF) 100 MCG/2ML IJ SOLN
25.0000 ug | INTRAMUSCULAR | Status: DC | PRN
  Administered 2021-04-10: 25 ug via INTRAVENOUS

## 2021-04-10 MED ORDER — METHYLENE BLUE 0.5 % INJ SOLN
INTRAVENOUS | Status: AC
  Filled 2021-04-10: qty 10

## 2021-04-10 MED ORDER — FENTANYL CITRATE (PF) 100 MCG/2ML IJ SOLN
INTRAMUSCULAR | Status: AC
  Administered 2021-04-10: 25 ug via INTRAVENOUS
  Filled 2021-04-10: qty 2

## 2021-04-10 MED ORDER — METHYLENE BLUE 0.5 % INJ SOLN
INTRAVENOUS | Status: DC | PRN
  Administered 2021-04-10: 3 mL

## 2021-04-10 MED ORDER — CHLORHEXIDINE GLUCONATE 0.12 % MT SOLN: 15.0000 mL | Freq: Once | OROMUCOSAL | Status: AC

## 2021-04-10 MED ORDER — LIDOCAINE HCL (CARDIAC) PF 100 MG/5ML IV SOSY
PREFILLED_SYRINGE | INTRAVENOUS | Status: DC | PRN
  Administered 2021-04-10: 100 mg via INTRAVENOUS

## 2021-04-10 MED ORDER — CHLORHEXIDINE GLUCONATE 0.12 % MT SOLN
OROMUCOSAL | Status: AC
  Administered 2021-04-10: 15 mL via OROMUCOSAL
  Filled 2021-04-10: qty 15

## 2021-04-10 MED ORDER — HYDROCODONE-ACETAMINOPHEN 5-325 MG PO TABS: 1.0000 | ORAL_TABLET | ORAL | 0 refills | Status: DC | PRN

## 2021-04-10 MED ORDER — CHLORHEXIDINE GLUCONATE CLOTH 2 % EX PADS: 6.0000 | MEDICATED_PAD | Freq: Once | CUTANEOUS | Status: DC

## 2021-04-10 MED ORDER — OXYCODONE HCL 5 MG/5ML PO SOLN: 5.0000 mg | Freq: Once | ORAL | Status: AC | PRN

## 2021-04-10 MED ORDER — BUPIVACAINE-EPINEPHRINE (PF) 0.5% -1:200000 IJ SOLN
INTRAMUSCULAR | Status: AC
  Filled 2021-04-10: qty 30

## 2021-04-10 MED ORDER — ACETAMINOPHEN 10 MG/ML IV SOLN
INTRAVENOUS | Status: AC
  Filled 2021-04-10: qty 100

## 2021-04-10 MED ORDER — PROPOFOL 10 MG/ML IV BOLUS
INTRAVENOUS | Status: DC | PRN
  Administered 2021-04-10 (×2): 100 mg via INTRAVENOUS

## 2021-04-10 MED ORDER — PROPOFOL 10 MG/ML IV BOLUS
INTRAVENOUS | Status: AC
  Filled 2021-04-10: qty 20

## 2021-04-10 MED ORDER — SUCCINYLCHOLINE CHLORIDE 200 MG/10ML IV SOSY
PREFILLED_SYRINGE | INTRAVENOUS | Status: DC | PRN
  Administered 2021-04-10: 100 mg via INTRAVENOUS

## 2021-04-10 MED ORDER — OXYCODONE HCL 5 MG PO TABS
5.0000 mg | ORAL_TABLET | Freq: Once | ORAL | Status: AC | PRN
  Administered 2021-04-10: 5 mg via ORAL

## 2021-04-10 MED ORDER — PROPOFOL 500 MG/50ML IV EMUL
INTRAVENOUS | Status: DC | PRN
  Administered 2021-04-10: 200 ug/kg/min via INTRAVENOUS

## 2021-04-10 MED ORDER — EPHEDRINE SULFATE (PRESSORS) 50 MG/ML IJ SOLN
INTRAMUSCULAR | Status: DC | PRN
Start: 2021-04-10 — End: 2021-04-10
  Administered 2021-04-10: 5 mg via INTRAVENOUS

## 2021-04-10 MED ORDER — ONDANSETRON HCL 4 MG/2ML IJ SOLN: 4.0000 mg | Freq: Once | INTRAMUSCULAR | Status: DC | PRN

## 2021-04-10 MED ORDER — ORAL CARE MOUTH RINSE: 15.0000 mL | Freq: Once | OROMUCOSAL | Status: AC

## 2021-04-10 SURGICAL SUPPLY — 50 items
APL PRP STRL LF DISP 70% ISPRP (MISCELLANEOUS) ×1
BLADE BOVIE TIP EXT 4 (BLADE) IMPLANT
BLADE SURG 15 STRL SS SAFETY (BLADE) ×4 IMPLANT
CHLORAPREP W/TINT 26 (MISCELLANEOUS) ×2 IMPLANT
CNTNR SPEC 2.5X3XGRAD LEK (MISCELLANEOUS)
CONT SPEC 4OZ STER OR WHT (MISCELLANEOUS)
CONT SPEC 4OZ STRL OR WHT (MISCELLANEOUS)
CONTAINER SPEC 2.5X3XGRAD LEK (MISCELLANEOUS) IMPLANT
COVER PROBE FLX POLY STRL (MISCELLANEOUS) ×2 IMPLANT
DEVICE DUBIN SPECIMEN MAMMOGRA (MISCELLANEOUS) ×2 IMPLANT
DRAPE LAPAROTOMY TRNSV 106X77 (MISCELLANEOUS) ×2 IMPLANT
DRSG GAUZE FLUFF 36X18 (GAUZE/BANDAGES/DRESSINGS) ×4 IMPLANT
DRSG TELFA 3X8 NADH (GAUZE/BANDAGES/DRESSINGS) ×2 IMPLANT
ELECT CAUTERY BLADE TIP 2.5 (TIP) ×2
ELECT REM PT RETURN 9FT ADLT (ELECTROSURGICAL) ×2
ELECTRODE CAUTERY BLDE TIP 2.5 (TIP) ×1 IMPLANT
ELECTRODE REM PT RTRN 9FT ADLT (ELECTROSURGICAL) ×1 IMPLANT
GAUZE 4X4 16PLY ~~LOC~~+RFID DBL (SPONGE) ×2 IMPLANT
GLOVE SURG ENC MOIS LTX SZ7.5 (GLOVE) ×2 IMPLANT
GLOVE SURG UNDER LTX SZ8 (GLOVE) ×2 IMPLANT
GOWN STRL REUS W/ TWL LRG LVL3 (GOWN DISPOSABLE) ×2 IMPLANT
GOWN STRL REUS W/TWL LRG LVL3 (GOWN DISPOSABLE) ×4
KIT TURNOVER KIT A (KITS) ×2 IMPLANT
LABEL OR SOLS (LABEL) ×2 IMPLANT
MANIFOLD NEPTUNE II (INSTRUMENTS) ×2 IMPLANT
MARGIN MAP 10MM (MISCELLANEOUS) ×2 IMPLANT
NDL HYPO 25X1 1.5 SAFETY (NEEDLE) ×2 IMPLANT
NEEDLE HYPO 22GX1.5 SAFETY (NEEDLE) ×2 IMPLANT
NEEDLE HYPO 25X1 1.5 SAFETY (NEEDLE) ×4 IMPLANT
PACK BASIN MINOR ARMC (MISCELLANEOUS) ×2 IMPLANT
PAD DRESSING TELFA 3X8 NADH (GAUZE/BANDAGES/DRESSINGS) ×1 IMPLANT
PENCIL ELECTRO HAND CTR (MISCELLANEOUS) ×2 IMPLANT
RETRACTOR RING XSMALL (MISCELLANEOUS) IMPLANT
RTRCTR WOUND ALEXIS 13CM XS SH (MISCELLANEOUS)
SLEVE PROBE SENORX GAMMA FIND (MISCELLANEOUS) IMPLANT
STRIP CLOSURE SKIN 1/2X4 (GAUZE/BANDAGES/DRESSINGS) ×2 IMPLANT
SUT SILK 2 0 (SUTURE) ×2
SUT SILK 2-0 18XBRD TIE 12 (SUTURE) ×1 IMPLANT
SUT VIC AB 2-0 CT1 27 (SUTURE) ×4
SUT VIC AB 2-0 CT1 TAPERPNT 27 (SUTURE) ×2 IMPLANT
SUT VIC AB 3-0 54X BRD REEL (SUTURE) ×1 IMPLANT
SUT VIC AB 3-0 BRD 54 (SUTURE) ×2
SUT VIC AB 4-0 FS2 27 (SUTURE) ×5 IMPLANT
SWABSTK COMLB BENZOIN TINCTURE (MISCELLANEOUS) ×2 IMPLANT
SYR 10ML LL (SYRINGE) ×2 IMPLANT
SYR BULB IRRIG 60ML STRL (SYRINGE) ×2 IMPLANT
TAPE TRANSPORE STRL 2 31045 (GAUZE/BANDAGES/DRESSINGS) ×2 IMPLANT
TRAP NEPTUNE SPECIMEN COLLECT (MISCELLANEOUS) ×2 IMPLANT
WATER STERILE IRR 1000ML POUR (IV SOLUTION) ×2 IMPLANT
WATER STERILE IRR 500ML POUR (IV SOLUTION) ×2 IMPLANT

## 2021-04-10 NOTE — Discharge Instructions (Signed)

## 2021-04-10 NOTE — Op Note (Signed)
Reoperative diagnosis: Newly diagnosed identified right breast cancer, desire for breast conservation.  Postoperative diagnosis: Same.  Operative procedure: Wide local excision, sentinel node biopsy with ultrasound guidance, pectoralis block.  Operating surgeon: Hervey Ard, MD.  Anesthesia: General endotracheal, Marcaine 0.5% with 1: 200,000 units of epinephrine.  Estimated blood loss: Less than 3 cc.  Clinical note: This 61 year old woman recently had an abnormal mammogram and biopsy showed evidence of invasive mammary carcinoma.  She desired breast conservation.  She is brought to the op room having received technetium injection the morning of the procedure.  Operative note: With the patient under adequate general anesthesia, SCD stockings in place the breast was prepped with alcohol and a total of 3 cc of 0.5% methylene blue was injected in the subareolar plexus.  The breast was then cleansed with ChloraPrep and draped.  Ultrasound was brought onto the field and the tumor mass in the 8 o'clock position about 5 cm from the nipple was identified.  Image placed in the chart for permanent record.  This was very close to the subcutaneous fat.  A radial incision was made after instillation of local anesthesia.  The skin was incised sharply and then the subcutaneous fat was incised and a 3 x 4 x 3 cm block of tissue was excised, orientated and specimen radiograph confirmed the previously placed clip.  While the breast specimen was being processed attention was turned to the axilla.  The node seeker device was used to identify the area of increased uptake.  Field block anesthesia and a pectoralis block was then completed under ultrasound guidance.  A small transverse incision was made at the base of the axillary fold.  The skin was incised sharply and the remaining dissection completed with electrocautery.  The axillary envelope was opened.  2 small, 4 mm blue hot lymph nodes were identified and  resected and sent in formalin for routine histology.  Scanning through the axilla showed counts had fallen from 1000 to less than 100.  Good hemostasis was noted.  The axillary envelope was closed with a 2-0 Vicryl figure-of-eight suture.  The adipose tissue was approximated in similar fashion.  The skin was closed with a running 4-0 Vicryl subcuticular suture.  The report from pathology showed no gross residual tumor and no obviously positive margins.  The breast tissue was then approximated with interrupted 2-0 Vicryl figure-of-eight sutures in multiple layers.  The adipose tissue was approximated in similar fashion.  The skin was closed with a running 4-0 Vicryl subcuticular suture.  Benzoin and Steri-Strips followed by Telfa, fluff gauze and a compressive wrap were applied to both sites.  The patient tolerated the procedure well and was taken to the PACU in stable condition.

## 2021-04-10 NOTE — Anesthesia Preprocedure Evaluation (Addendum)
Anesthesia Evaluation  Patient identified by MRN, date of birth, ID band Patient awake    Reviewed: Allergy & Precautions, NPO status , Patient's Chart, lab work & pertinent test results  History of Anesthesia Complications Negative for: history of anesthetic complications  Airway Mallampati: I   Neck ROM: Full    Dental no notable dental hx.    Pulmonary former smoker (quit 2016),    Pulmonary exam normal breath sounds clear to auscultation       Cardiovascular Exercise Tolerance: Good negative cardio ROS Normal cardiovascular exam Rhythm:Regular Rate:Normal     Neuro/Psych PSYCHIATRIC DISORDERS Anxiety Depression negative neurological ROS     GI/Hepatic GERD  ,  Endo/Other  negative endocrine ROS  Renal/GU negative Renal ROS     Musculoskeletal   Abdominal   Peds  Hematology Breast CA   Anesthesia Other Findings   Reproductive/Obstetrics                            Anesthesia Physical Anesthesia Plan  ASA: 2  Anesthesia Plan: General   Post-op Pain Management:    Induction: Intravenous  PONV Risk Score and Plan: 3 and Ondansetron, Dexamethasone, Treatment may vary due to age or medical condition, TIVA and Propofol infusion  Airway Management Planned: LMA  Additional Equipment:   Intra-op Plan:   Post-operative Plan: Extubation in OR  Informed Consent: I have reviewed the patients History and Physical, chart, labs and discussed the procedure including the risks, benefits and alternatives for the proposed anesthesia with the patient or authorized representative who has indicated his/her understanding and acceptance.     Dental advisory given  Plan Discussed with: CRNA  Anesthesia Plan Comments: (Plan for TIVA for PONV ppx and pt request.  Patient consented for risks of anesthesia including but not limited to:  - adverse reactions to medications - damage to eyes, teeth,  lips or other oral mucosa - nerve damage due to positioning  - sore throat or hoarseness - damage to heart, brain, nerves, lungs, other parts of body or loss of life  Informed patient about role of CRNA in peri- and intra-operative care.  Patient voiced understanding.)       Anesthesia Quick Evaluation

## 2021-04-10 NOTE — Anesthesia Procedure Notes (Signed)
Procedure Name: Intubation Date/Time: 04/10/2021 1:35 PM Performed by: Beverely Low, CRNA Pre-anesthesia Checklist: Patient identified, Patient being monitored, Timeout performed, Emergency Drugs available and Suction available Patient Re-evaluated:Patient Re-evaluated prior to induction Oxygen Delivery Method: Circle system utilized Preoxygenation: Pre-oxygenation with 100% oxygen Induction Type: IV induction Ventilation: Mask ventilation without difficulty Laryngoscope Size: 3 and McGraph Grade View: Grade I Tube type: Oral Tube size: 7.0 mm Number of attempts: 1 Airway Equipment and Method: Stylet Placement Confirmation: ETT inserted through vocal cords under direct vision, positive ETCO2 and breath sounds checked- equal and bilateral Secured at: 21 cm Tube secured with: Tape Dental Injury: Teeth and Oropharynx as per pre-operative assessment

## 2021-04-10 NOTE — H&P (Signed)
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  Tolerated SLN injection well.   Medications Prior to Admission  Medication Sig Dispense Refill Last Dose   omeprazole (PRILOSEC) 20 MG capsule Take 20 mg by mouth daily.      lidocaine-prilocaine (EMLA) cream Apply to areola one hour prior to arrival day of procedure. Cover with saran wrap. 30 g 0    zolpidem (AMBIEN) 5 MG tablet Take 1 tablet (5 mg total) by mouth at bedtime as needed for sleep. 30 tablet 0    No Known Allergies Past Medical History:  Diagnosis Date   Adenomatous polyp 2019   Anxiety    Breast cancer ([place]) 03/22/2021   Chicken pox    Depression    GERD (gastroesophageal reflux disease)    Past Surgical History:  Procedure Laterality Date   BREAST BIOPSY Left 2010   biopsy of a lump   CESAREAN SECTION  1989   COLONOSCOPY  2019   Adenomatous Polyp: [facility] 12/2017: Recall ltr mailed 12/11/17   TUBAL LIGATION  2000   Social History   Socioeconomic History   Marital status: Married    Spouse name: Not on file   Number of children: 1   Years of education: Not on file   Highest education level: Bachelor's degree (e.g., BA, AB, BS)  Occupational History   Not on file  Tobacco Use   Smoking status: Former    Packs/day: 0.50    Years: 30.00    Pack years: 15.00    Types: Cigarettes    Quit date: 04/10/2014    Years since quitting: 7.0   Smokeless tobacco: Never  Vaping Use   Vaping Use: Never used  Substance and Sexual Activity   Alcohol use: Yes    Alcohol/week: 1.0 standard drink    Types: 1 Glasses of wine per week    Comment: on special occassions   Drug use: No   Sexual activity: Yes    Partners: Male    Birth control/protection: None  Other Topics Concern   Not on file  Social History Narrative   Not on file   Social Determinants of Health   Financial Resource Strain: Not on file  Food Insecurity:  Not on file  Transportation Needs: Not on file  Physical Activity: Not on file  Stress: Not on file  Social Connections: Not on file  Intimate Partner Violence: Not on file   Social History   Social History Narrative   Not on file     ROS: Negative.     PE: HEENT: Negative. Lungs: Clear. Cardio: RR.   Assessment/Plan:  Proceed with planned right breast wide excision and SLN biopsy.    Forest Gleason [facility] 04/10/2021

## 2021-04-10 NOTE — Transfer of Care (Signed)
Immediate Anesthesia Transfer of Care Note  Patient: Heather Jensen  Procedure(s) Performed: BREAST LUMPECTOMY WITH SENTINEL LYMPH NODE BX (Right: Breast)  Patient Location: PACU  Anesthesia Type:General  Level of Consciousness: sedated and drowsy  Airway & Oxygen Therapy: Patient Spontanous Breathing and Patient connected to nasal cannula oxygen  Post-op Assessment: Report given to RN and Post -op Vital signs reviewed and stable  Post vital signs: Reviewed and stable  Last Vitals:  Vitals Value Taken Time  BP 112/46 04/10/21 1439  Temp 36 C 04/10/21 1439  Pulse 83 04/10/21 1443  Resp 18 04/10/21 1443  SpO2 99 % 04/10/21 1443  Vitals shown include unvalidated device data.  Last Pain:  Vitals:   04/10/21 1439  TempSrc:   PainSc: 0-No pain         Complications: No notable events documented.

## 2021-04-10 NOTE — Anesthesia Postprocedure Evaluation (Signed)
Anesthesia Post Note  Patient: Heather Jensen  Procedure(s) Performed: BREAST LUMPECTOMY WITH SENTINEL LYMPH NODE BX (Right: Breast)  Patient location during evaluation: PACU Anesthesia Type: General Level of consciousness: awake and alert, oriented and patient cooperative Pain management: pain level controlled Vital Signs Assessment: post-procedure vital signs reviewed and stable Respiratory status: spontaneous breathing, nonlabored ventilation and respiratory function stable Cardiovascular status: blood pressure returned to baseline and stable Postop Assessment: adequate PO intake Anesthetic complications: no   No notable events documented.   Last Vitals:  Vitals:   04/10/21 1445 04/10/21 1500  BP: 124/62 110/67  Pulse: 92 83  Resp: 18 15  Temp:    SpO2: 100% 100%    Last Pain:  Vitals:   04/10/21 1500  TempSrc:   PainSc: [place]

## 2021-04-10 NOTE — Anesthesia Procedure Notes (Signed)
Procedure Name: LMA Insertion Date/Time: 04/10/2021 1:30 PM Performed by: Beverely Low, CRNA Pre-anesthesia Checklist: Patient identified, Patient being monitored, Timeout performed, Emergency Drugs available and Suction available Patient Re-evaluated:Patient Re-evaluated prior to induction Oxygen Delivery Method: Circle system utilized Preoxygenation: Pre-oxygenation with 100% oxygen Induction Type: IV induction Ventilation: Mask ventilation without difficulty LMA: LMA inserted LMA Size: 3.0 Tube type: Oral Number of attempts: 1 Placement Confirmation: positive ETCO2 and breath sounds checked- equal and bilateral Tube secured with: Tape Dental Injury: Teeth and Oropharynx as per pre-operative assessment

## 2021-04-11 ENCOUNTER — Encounter: Payer: Self-pay | Admitting: General Surgery

## 2021-04-12 ENCOUNTER — Other Ambulatory Visit: Payer: Self-pay | Admitting: Pathology

## 2021-04-12 LAB — SURGICAL PATHOLOGY

## 2021-04-19 NOTE — Progress Notes (Signed)
?[place]  ?[phone]) [phone] [phone] ? ?ID: Nolon Stalls OB: 30-Jan-1961  MR#: [id number]  XYI#:[id number] ? ?Patient Care Team: ?Jearld Fenton, NP as PCP - General (Internal Medicine) ?Lloyd Huger, MD as Consulting Physician (Oncology) ?Robert Bellow, MD as Consulting Physician (General Surgery) ?Noreene Filbert, MD as Consulting Physician (Radiation Oncology) ? ?CHIEF COMPLAINT: Pathologic stage Ia ER/PR positive, HER-2 negative invasive carcinoma of the lower outer quadrant of the right breast. ? ?INTERVAL HISTORY: Patient returns to clinic today for further evaluation, discussion of her final pathology results, and treatment planning.  She underwent lumpectomy on April 10, 2021 and tolerated the procedure well.  She is anxious, but otherwise feels well.  She has no neurologic complaints.  She denies any recent fevers or illnesses.  She has a good appetite and denies weight loss.  She has no chest pain, shortness of breath, cough, or hemoptysis.  She denies any nausea, vomiting, constipation, or diarrhea.  She has no urinary complaints.  Patient offers no further specific complaints today. ? ?REVIEW OF SYSTEMS:   ?Review of Systems  ?Constitutional: Negative.  Negative for fever, malaise/fatigue and weight loss.  ?Respiratory: Negative.  Negative for cough, hemoptysis and shortness of breath.   ?Cardiovascular: Negative.  Negative for chest pain and leg swelling.  ?Gastrointestinal: Negative.  Negative for abdominal pain.  ?Genitourinary: Negative.  Negative for dysuria.  ?Musculoskeletal: Negative.  Negative for back pain.  ?Skin: Negative.  Negative for rash.  ?Neurological: Negative.  Negative for dizziness, focal weakness, weakness and headaches.  ?Psychiatric/Behavioral:  The patient is nervous/anxious.   ? ?As per HPI. Otherwise, a complete review of systems is negative. ? ?PAST MEDICAL HISTORY: ?Past Medical History:  ?Diagnosis Date  ?  Adenomatous polyp 2019  ? Anxiety   ? Breast cancer ([place]) 03/22/2021  ? Chicken pox   ? Depression   ? GERD (gastroesophageal reflux disease)   ? ? ?PAST SURGICAL HISTORY: ?Past Surgical History:  ?Procedure Laterality Date  ? BREAST BIOPSY Left 2010  ? biopsy of a lump  ? BREAST LUMPECTOMY WITH SENTINEL LYMPH NODE BIOPSY Right 04/10/2021  ? Procedure: BREAST LUMPECTOMY WITH SENTINEL LYMPH NODE BX;  Surgeon: Robert Bellow, MD;  Location: [facility] ORS;  Service: General;  Laterality: Right;  ? [place]  ? COLONOSCOPY  2019  ? Adenomatous Polyp: CBF 12/2017: Recall ltr mailed 12/11/17  ? TUBAL LIGATION  2000  ? ? ?FAMILY HISTORY: ?Family History  ?Problem Relation Age of Onset  ? Hyperlipidemia Mother   ? Alzheimer's disease Mother   ? Hypertension Father   ? Heart disease Father   ? Lung cancer Paternal Grandfather   ? Melanoma Brother   ? Breast cancer Neg Hx   ? Stroke Neg Hx   ? ? ?ADVANCED DIRECTIVES (Y/N):  N ? ?HEALTH MAINTENANCE: ?Social History  ? ?Tobacco Use  ? Smoking status: Former  ?  Packs/day: 0.50  ?  Years: 30.00  ?  Pack years: 15.00  ?  Types: Cigarettes  ?  Quit date: 04/10/2014  ?  Years since quitting: 7.0  ? Smokeless tobacco: Never  ?Vaping Use  ? Vaping Use: Never used  ?Substance Use Topics  ? Alcohol use: Yes  ?  Alcohol/week: 1.0 standard drink  ?  Types: 1 Glasses of wine per week  ?  Comment: on special occassions  ? Drug use: No  ? ? ? Colonoscopy: ? PAP: ? Bone density: ? Lipid panel: ? ?  No Known Allergies ? ?Current Outpatient Medications  ?Medication Sig Dispense Refill  ? omeprazole (PRILOSEC) 20 MG capsule Take 20 mg by mouth daily.    ? zolpidem (AMBIEN) 5 MG tablet Take 1 tablet (5 mg total) by mouth at bedtime as needed for sleep. 30 tablet 0  ? HYDROcodone-acetaminophen (NORCO/VICODIN) 5-325 MG tablet Take 1 tablet by mouth every 4 (four) hours as needed for moderate pain. (Patient not taking: Reported on 04/25/2021) 10 tablet 0  ? ?No current facility-administered  medications for this visit.  ? ? ?OBJECTIVE: ?Vitals:  ? 04/25/21 1435  ?BP: 131/80  ?Pulse: 75  ?Resp: 18  ?   Body mass index is 24.53 kg/m?Marland Kitchen    ECOG FS:0 - Asymptomatic ? ?General: Well-developed, well-nourished, no acute distress. ?Eyes: Pink conjunctiva, anicteric sclera. ?HEENT: Normocephalic, moist mucous membranes. ?Breast: Exam deferred today. ?Lungs: No audible wheezing or coughing. ?Heart: Regular rate and rhythm. ?Abdomen: Soft, nontender, no obvious distention. ?Musculoskeletal: No edema, cyanosis, or clubbing. ?Neuro: Alert, answering all questions appropriately. Cranial nerves grossly intact. ?Skin: No rashes or petechiae noted. ?Psych: Normal affect. ? ? ?LAB RESULTS: ? ?Lab Results  ?Component Value Date  ? NA 139 12/05/2020  ? K 4.5 12/05/2020  ? CL 104 12/05/2020  ? CO2 28 12/05/2020  ? GLUCOSE 85 12/05/2020  ? BUN 15 12/05/2020  ? CREATININE 0.83 12/05/2020  ? CALCIUM 9.5 12/05/2020  ? PROT 6.9 12/05/2020  ? ALBUMIN 4.3 10/17/2015  ? AST 14 12/05/2020  ? ALT 14 12/05/2020  ? ALKPHOS 38 (L) 10/17/2015  ? BILITOT 1.1 12/05/2020  ? GFRNONAA 78 11/04/2019  ? GFRAA 90 11/04/2019  ? ? ?Lab Results  ?Component Value Date  ? WBC 5.2 12/05/2020  ? NEUTROABS 6,387 11/04/2019  ? HGB 14.0 12/05/2020  ? HCT 41.7 12/05/2020  ? MCV 91.6 12/05/2020  ? PLT 291 12/05/2020  ? ? ? ?STUDIES: ?NM Sentinel Node Inj-No Rpt (Breast) ? ?Result Date: 04/10/2021 ?Sulfur Colloid was injected by the Nuclear Medicine Technologist for sentinel lymph node localization.  ? ?MM Breast Surgical Specimen ? ?Result Date: 04/10/2021 ?CLINICAL DATA:  Biopsy-proven grade 2 invasive ductal carcinoma involving the LOWER OUTER QUADRANT of the RIGHT breast. EXAM: SPECIMEN RADIOGRAPH OF THE RIGHT BREAST COMPARISON:  Previous exam(s). FINDINGS: Status post excision of the RIGHT breast. The coil shaped tissue marking clip associated with the biopsy-proven malignancy is present in the specimen. The specimen was submitted for interpretation  post-operatively. IMPRESSION: Specimen radiograph of the RIGHT breast. Electronically Signed   By: Evangeline Dakin M.D.   On: 04/10/2021 14:30  ? ?ASSESSMENT: Pathologic stage Ia ER/PR positive, HER-2 negative invasive carcinoma of the lower outer quadrant of the right breast. ? ?PLAN:   ? ?Pathologic stage Ia ER/PR positive, HER-2 negative invasive carcinoma of the lower outer quadrant of the right breast: Patient underwent lumpectomy on April 10, 2021 confirming stage of disease.  Oncotype score is pending at time of dictation.  Patient had consultation with radiation oncology today to discuss adjuvant XRT.  She will also benefit from an aromatase inhibitor for 5 years at the completion of her treatments.  No further intervention is needed at this time.  Unless patient's Oncotype returns high risk, she will return to clinic at the end of her XRT for further evaluation and initiation of letrozole.    ?Bone health: Patient will require a baseline bone mineral density in the near future. ? ?I spent a total of 30 minutes reviewing chart data, face-to-face evaluation with  the patient, counseling and coordination of care as detailed above. ? ? ?Patient expressed understanding and was in agreement with this plan. She also understands that She can call clinic at any time with any questions, concerns, or complaints.  ? ? Cancer Staging  ?Carcinoma of lower outer quadrant of right breast ([place]) ?Staging form: Breast, AJCC 8th Edition ?- Clinical stage from 04/01/2021: Stage IA (cT1b, cN0, cM0, G2, ER+, PR+, HER2-) - Signed by Lloyd Huger, MD on 04/03/2021 ?Stage prefix: Initial diagnosis ?Histologic grading system: 3 grade system ? ? ?Lloyd Huger, MD   04/25/2021 3:53 PM ? ? ? ? ?

## 2021-04-22 ENCOUNTER — Telehealth: Payer: Self-pay | Admitting: Emergency Medicine

## 2021-04-22 ENCOUNTER — Other Ambulatory Visit: Payer: Self-pay | Admitting: *Deleted

## 2021-04-22 DIAGNOSIS — Z170 Estrogen receptor positive status [ER+]: Secondary | ICD-10-CM

## 2021-04-22 DIAGNOSIS — C50911 Malignant neoplasm of unspecified site of right female breast: Secondary | ICD-10-CM

## 2021-04-22 NOTE — Telephone Encounter (Signed)
Oncotype order placed online. Demographics, insurance cards and pathology results faxed and confirmation received.

## 2021-04-25 ENCOUNTER — Encounter: Payer: Self-pay | Admitting: Radiation Oncology

## 2021-04-25 ENCOUNTER — Other Ambulatory Visit: Payer: Self-pay

## 2021-04-25 ENCOUNTER — Telehealth: Payer: Self-pay | Admitting: *Deleted

## 2021-04-25 ENCOUNTER — Ambulatory Visit: Payer: No Typology Code available for payment source | Admitting: Radiation Oncology

## 2021-04-25 ENCOUNTER — Inpatient Hospital Stay: Payer: No Typology Code available for payment source | Attending: Oncology | Admitting: Oncology

## 2021-04-25 ENCOUNTER — Encounter: Payer: Self-pay | Admitting: Oncology

## 2021-04-25 ENCOUNTER — Ambulatory Visit
Admission: RE | Admit: 2021-04-25 | Discharge: 2021-04-25 | Disposition: A | Discharge status: Home / Self Care | Payer: No Typology Code available for payment source | Source: Ambulatory Visit | Attending: Radiation Oncology | Admitting: Radiation Oncology

## 2021-04-25 VITALS — BP 131/80 | HR 75 | Resp 18 | Ht 66.0 in | Wt 152.0 lb

## 2021-04-25 VITALS — BP 131/80 | HR 75 | Resp 18 | Wt 152.0 lb

## 2021-04-25 DIAGNOSIS — Z170 Estrogen receptor positive status [ER+]: Secondary | ICD-10-CM | POA: Insufficient documentation

## 2021-04-25 DIAGNOSIS — C50511 Malignant neoplasm of lower-outer quadrant of right female breast: Secondary | ICD-10-CM

## 2021-04-25 DIAGNOSIS — Z87891 Personal history of nicotine dependence: Secondary | ICD-10-CM | POA: Insufficient documentation

## 2021-04-25 NOTE — Telephone Encounter (Signed)
Call from [facility] requesting information (phone, fax, address) of the path lab to obtain the block from for testing as what is on the order they received is the cancer center address. Please return call listed in contact on this note ?

## 2021-04-25 NOTE — Consult Note (Signed)
?NEW PATIENT EVALUATION ? ?Name: Heather Jensen [facility]  ?MRN: [id number]  ?Date:   04/25/2021     ?DOB: [birth year] ? ? ?This 61 y.o. female patient presents to the clinic for initial evaluation of stage Ia ER/PR positive invasive mammary carcinoma of the right breast status post wide local excision and sentinel node biopsy. ? ?REFERRING PHYSICIAN: Jearld Fenton, NP ? ?CHIEF COMPLAINT:  ?Chief Complaint  ?Patient presents with  ? Consult  ?  Breast  ? ? ?DIAGNOSIS: There were no encounter diagnoses. ?  ?PREVIOUS INVESTIGATIONS:  ?Mammogram and ultrasound reviewed ?Pathology report reviewed ?Clinical notes reviewed ? ?HPI: Patient is a 61 year old female who presented with an abnormal mammogram of her right breast showing a lesion at the 8 o'clock position 3 cm from the nipple measuring 6 x 5 x 6 mm.  Sonographic evaluation the right axilla did not show any enlarged nodes.  She underwent ultrasound guided \ guided biopsy which was positive for invasive mammary carcinoma.  She then underwent a wide local excision and sentinel node biopsy tumor was 1.2 cm overall grade 2.  Margins were clear at 2.6 mm.  There was some DCIS present although that margin was clear also 3 mm.  2 sentinel lymph nodes were examined both negative for metastatic disease.  Tumor was ER/PR positive HER2/neu not overexpressed.  She has an Oncotype DX pending.  Seen today for evaluation she is doing well specifically denies breast tenderness cough or bone pain. ? ?PLANNED TREATMENT REGIMEN: Right whole breast radiation ? ?PAST MEDICAL HISTORY:  has a past medical history of Adenomatous polyp (2019), Anxiety, Breast cancer ([place]) (03/22/2021), Chicken pox, Depression, and GERD (gastroesophageal reflux disease).   ? ?PAST SURGICAL HISTORY:  ?Past Surgical History:  ?Procedure Laterality Date  ? BREAST BIOPSY Left 2010  ? biopsy of a lump  ? BREAST LUMPECTOMY WITH SENTINEL LYMPH NODE BIOPSY Right 04/10/2021  ? Procedure: BREAST LUMPECTOMY WITH SENTINEL  LYMPH NODE BX;  Surgeon: Robert Bellow, MD;  Location: [facility] ORS;  Service: General;  Laterality: Right;  ? [place]  ? COLONOSCOPY  2019  ? Adenomatous Polyp: CBF 12/2017: Recall ltr mailed 12/11/17  ? TUBAL LIGATION  2000  ? ? ?FAMILY HISTORY: family history includes Alzheimer's disease in her mother; Heart disease in her father; Hyperlipidemia in her mother; Hypertension in her father; Lung cancer in her paternal grandfather; Melanoma in her brother. ? ?SOCIAL HISTORY:  reports that she quit smoking about 7 years ago. Her smoking use included cigarettes. She has a 15.00 pack-year smoking history. She has never used smokeless tobacco. She reports current alcohol use of about 1.0 standard drink per week. She reports that she does not use drugs. ? ?ALLERGIES: Patient has no known allergies. ? ?MEDICATIONS:  ?Current Outpatient Medications  ?Medication Sig Dispense Refill  ? HYDROcodone-acetaminophen (NORCO/VICODIN) 5-325 MG tablet Take 1 tablet by mouth every 4 (four) hours as needed for moderate pain. (Patient not taking: Reported on 04/25/2021) 10 tablet 0  ? omeprazole (PRILOSEC) 20 MG capsule Take 20 mg by mouth daily.    ? zolpidem (AMBIEN) 5 MG tablet Take 1 tablet (5 mg total) by mouth at bedtime as needed for sleep. 30 tablet 0  ? ?No current facility-administered medications for this encounter.  ? ? ?ECOG PERFORMANCE STATUS:  0 - Asymptomatic ? ?REVIEW OF SYSTEMS: ?Patient denies any weight loss, fatigue, weakness, fever, chills or night sweats. Patient denies any loss of vision, blurred vision. Patient denies any ringing  of the ears or hearing loss. No irregular heartbeat. Patient denies heart murmur or history of fainting. Patient denies any chest pain or pain radiating to her upper extremities. Patient denies any shortness of breath, difficulty breathing at night, cough or hemoptysis. Patient denies any swelling in the lower legs. Patient denies any nausea vomiting, vomiting of blood, or  coffee ground material in the vomitus. Patient denies any stomach pain. Patient states has had normal bowel movements no significant constipation or diarrhea. Patient denies any dysuria, hematuria or significant nocturia. Patient denies any problems walking, swelling in the joints or loss of balance. Patient denies any skin changes, loss of hair or loss of weight. Patient denies any excessive worrying or anxiety or significant depression. Patient denies any problems with insomnia. Patient denies excessive thirst, polyuria, polydipsia. Patient denies any swollen glands, patient denies easy bruising or easy bleeding. Patient denies any recent infections, allergies or URI. Patient "s visual fields have not changed significantly in recent time. ?  ?PHYSICAL EXAM: ?BP 131/80   Pulse 75   Resp 18   Ht _0  (1.676 m)   Wt 152 lb (68.9 kg)   BMI 24.53 kg/m?  ?Right breast is wide local excision scar which is healed well.  No dominant masses noted in either breast.  No axillary or supraclavicular adenopathy is appreciated.  Well-developed well-nourished patient in NAD. HEENT reveals PERLA, EOMI, discs not visualized.  Oral cavity is clear. No oral mucosal lesions are identified. Neck is clear without evidence of cervical or supraclavicular adenopathy. Lungs are clear to A&P. Cardiac examination is essentially unremarkable with regular rate and rhythm without murmur rub or thrill. Abdomen is benign with no organomegaly or masses noted. Motor sensory and DTR levels are equal and symmetric in the upper and lower extremities. Cranial nerves II through XII are grossly intact. Proprioception is intact. No peripheral adenopathy or edema is identified. No motor or sensory levels are noted. Crude visual fields are within normal range. ? ?LABORATORY DATA: Pathology report reviewed ? ?  ?RADIOLOGY RESULTS: Mammogram and ultrasound reviewed ? ? ?IMPRESSION: Stage Ia ER/PR positive invasive mammary carcinoma of the right breast  status post wide local excision and sentinel node biopsy in 61 year old female ? ?PLAN: At this time we are awaiting her Oncotype DX to determine her need for chemotherapy.  Should she have a low risk of recurrence by Oncotype we will proceed with whole breast radiation.  I can treat her in a hypofractionated regimen over 3 weeks.  Would also boost her scar another 1000 cGy using electron beam.  Risks and benefits of treatment including skin reaction fatigue alteration of blood counts possible inclusion of superficial lung all were discussed in detail with the patient.  I have tentatively set her up for CT simulation in about a week's time after her Oncotype DX is back.  Patient comprehends her recommendations well. ? ?I would like to take this opportunity to thank you for allowing me to participate in the care of your patient.. ? ?Noreene Filbert, MD ? ? ? ? ? ? ? ? ?

## 2021-04-25 NOTE — Telephone Encounter (Signed)
Spoke with Minette Brine and provided correct fax, phone and address. ?

## 2021-04-30 ENCOUNTER — Encounter: Payer: Self-pay | Admitting: *Deleted

## 2021-05-01 ENCOUNTER — Encounter: Payer: Self-pay | Admitting: Internal Medicine

## 2021-05-01 ENCOUNTER — Other Ambulatory Visit: Payer: Self-pay | Admitting: Internal Medicine

## 2021-05-02 MED ORDER — ZOLPIDEM TARTRATE 5 MG PO TABS: 5.0000 mg | ORAL_TABLET | Freq: Every evening | ORAL | 0 refills | Status: DC | PRN

## 2021-05-03 ENCOUNTER — Encounter: Payer: Self-pay | Admitting: Oncology

## 2021-05-03 MED ORDER — ESCITALOPRAM OXALATE 10 MG PO TABS: 10.0000 mg | ORAL_TABLET | Freq: Every day | ORAL | 2 refills | Status: DC

## 2021-05-06 ENCOUNTER — Ambulatory Visit
Admission: RE | Admit: 2021-05-06 | Discharge: 2021-05-06 | Disposition: A | Discharge status: Home / Self Care | Payer: No Typology Code available for payment source | Source: Ambulatory Visit | Attending: Radiation Oncology | Admitting: Radiation Oncology

## 2021-05-06 DIAGNOSIS — Z170 Estrogen receptor positive status [ER+]: Secondary | ICD-10-CM | POA: Diagnosis not present

## 2021-05-06 DIAGNOSIS — Z510 Encounter for antineoplastic radiation therapy: Secondary | ICD-10-CM | POA: Diagnosis present

## 2021-05-06 DIAGNOSIS — C50511 Malignant neoplasm of lower-outer quadrant of right female breast: Secondary | ICD-10-CM | POA: Insufficient documentation

## 2021-05-07 DIAGNOSIS — Z510 Encounter for antineoplastic radiation therapy: Secondary | ICD-10-CM | POA: Diagnosis not present

## 2021-05-10 ENCOUNTER — Other Ambulatory Visit: Payer: Self-pay | Admitting: *Deleted

## 2021-05-10 DIAGNOSIS — C50511 Malignant neoplasm of lower-outer quadrant of right female breast: Secondary | ICD-10-CM

## 2021-05-13 ENCOUNTER — Ambulatory Visit: Admission: RE | Admit: 2021-05-13 | Payer: No Typology Code available for payment source | Source: Ambulatory Visit

## 2021-05-13 DIAGNOSIS — Z510 Encounter for antineoplastic radiation therapy: Secondary | ICD-10-CM | POA: Diagnosis not present

## 2021-05-14 ENCOUNTER — Ambulatory Visit
Admission: RE | Admit: 2021-05-14 | Discharge: 2021-05-14 | Disposition: A | Discharge status: Home / Self Care | Payer: No Typology Code available for payment source | Source: Ambulatory Visit | Attending: Radiation Oncology | Admitting: Radiation Oncology

## 2021-05-14 DIAGNOSIS — Z510 Encounter for antineoplastic radiation therapy: Secondary | ICD-10-CM | POA: Diagnosis not present

## 2021-05-15 ENCOUNTER — Ambulatory Visit
Admission: RE | Admit: 2021-05-15 | Discharge: 2021-05-15 | Disposition: A | Discharge status: Home / Self Care | Payer: No Typology Code available for payment source | Source: Ambulatory Visit | Attending: Radiation Oncology | Admitting: Radiation Oncology

## 2021-05-15 DIAGNOSIS — Z510 Encounter for antineoplastic radiation therapy: Secondary | ICD-10-CM | POA: Diagnosis not present

## 2021-05-16 ENCOUNTER — Ambulatory Visit
Admission: RE | Admit: 2021-05-16 | Discharge: 2021-05-16 | Disposition: A | Discharge status: Home / Self Care | Payer: No Typology Code available for payment source | Source: Ambulatory Visit | Attending: Radiation Oncology | Admitting: Radiation Oncology

## 2021-05-16 DIAGNOSIS — Z510 Encounter for antineoplastic radiation therapy: Secondary | ICD-10-CM | POA: Diagnosis not present

## 2021-05-17 ENCOUNTER — Ambulatory Visit
Admission: RE | Admit: 2021-05-17 | Discharge: 2021-05-17 | Disposition: A | Discharge status: Home / Self Care | Payer: No Typology Code available for payment source | Source: Ambulatory Visit | Attending: Radiation Oncology | Admitting: Radiation Oncology

## 2021-05-17 DIAGNOSIS — Z510 Encounter for antineoplastic radiation therapy: Secondary | ICD-10-CM | POA: Diagnosis not present

## 2021-05-20 ENCOUNTER — Ambulatory Visit
Admission: RE | Admit: 2021-05-20 | Discharge: 2021-05-20 | Disposition: A | Discharge status: Home / Self Care | Payer: No Typology Code available for payment source | Source: Ambulatory Visit | Attending: Radiation Oncology | Admitting: Radiation Oncology

## 2021-05-20 DIAGNOSIS — Z510 Encounter for antineoplastic radiation therapy: Secondary | ICD-10-CM | POA: Diagnosis not present

## 2021-05-21 ENCOUNTER — Ambulatory Visit
Admission: RE | Admit: 2021-05-21 | Discharge: 2021-05-21 | Disposition: A | Discharge status: Home / Self Care | Payer: No Typology Code available for payment source | Source: Ambulatory Visit | Attending: Radiation Oncology | Admitting: Radiation Oncology

## 2021-05-21 DIAGNOSIS — Z510 Encounter for antineoplastic radiation therapy: Secondary | ICD-10-CM | POA: Diagnosis not present

## 2021-05-22 ENCOUNTER — Ambulatory Visit
Admission: RE | Admit: 2021-05-22 | Discharge: 2021-05-22 | Disposition: A | Discharge status: Home / Self Care | Payer: No Typology Code available for payment source | Source: Ambulatory Visit | Attending: Radiation Oncology | Admitting: Radiation Oncology

## 2021-05-22 DIAGNOSIS — Z510 Encounter for antineoplastic radiation therapy: Secondary | ICD-10-CM | POA: Diagnosis not present

## 2021-05-23 ENCOUNTER — Ambulatory Visit
Admission: RE | Admit: 2021-05-23 | Discharge: 2021-05-23 | Disposition: A | Discharge status: Home / Self Care | Payer: No Typology Code available for payment source | Source: Ambulatory Visit | Attending: Radiation Oncology | Admitting: Radiation Oncology

## 2021-05-23 DIAGNOSIS — Z510 Encounter for antineoplastic radiation therapy: Secondary | ICD-10-CM | POA: Diagnosis not present

## 2021-05-24 ENCOUNTER — Ambulatory Visit
Admission: RE | Admit: 2021-05-24 | Discharge: 2021-05-24 | Disposition: A | Discharge status: Home / Self Care | Payer: No Typology Code available for payment source | Source: Ambulatory Visit | Attending: Radiation Oncology | Admitting: Radiation Oncology

## 2021-05-24 DIAGNOSIS — Z510 Encounter for antineoplastic radiation therapy: Secondary | ICD-10-CM | POA: Diagnosis not present

## 2021-05-27 ENCOUNTER — Ambulatory Visit
Admission: RE | Admit: 2021-05-27 | Discharge: 2021-05-27 | Disposition: A | Discharge status: Home / Self Care | Payer: No Typology Code available for payment source | Source: Ambulatory Visit | Attending: Radiation Oncology | Admitting: Radiation Oncology

## 2021-05-27 DIAGNOSIS — Z170 Estrogen receptor positive status [ER+]: Secondary | ICD-10-CM | POA: Diagnosis not present

## 2021-05-27 DIAGNOSIS — C50511 Malignant neoplasm of lower-outer quadrant of right female breast: Secondary | ICD-10-CM | POA: Insufficient documentation

## 2021-05-27 DIAGNOSIS — M8580 Other specified disorders of bone density and structure, unspecified site: Secondary | ICD-10-CM | POA: Diagnosis not present

## 2021-05-27 DIAGNOSIS — Z510 Encounter for antineoplastic radiation therapy: Secondary | ICD-10-CM | POA: Insufficient documentation

## 2021-05-28 ENCOUNTER — Ambulatory Visit
Admission: RE | Admit: 2021-05-28 | Discharge: 2021-05-28 | Disposition: A | Discharge status: Home / Self Care | Payer: No Typology Code available for payment source | Source: Ambulatory Visit | Attending: Radiation Oncology | Admitting: Radiation Oncology

## 2021-05-28 DIAGNOSIS — Z510 Encounter for antineoplastic radiation therapy: Secondary | ICD-10-CM | POA: Diagnosis not present

## 2021-05-29 ENCOUNTER — Inpatient Hospital Stay: Payer: No Typology Code available for payment source | Attending: Oncology

## 2021-05-29 ENCOUNTER — Ambulatory Visit
Admission: RE | Admit: 2021-05-29 | Discharge: 2021-05-29 | Disposition: A | Discharge status: Home / Self Care | Payer: No Typology Code available for payment source | Source: Ambulatory Visit | Attending: Radiation Oncology | Admitting: Radiation Oncology

## 2021-05-29 DIAGNOSIS — Z510 Encounter for antineoplastic radiation therapy: Secondary | ICD-10-CM | POA: Diagnosis not present

## 2021-05-29 DIAGNOSIS — M8580 Other specified disorders of bone density and structure, unspecified site: Secondary | ICD-10-CM | POA: Insufficient documentation

## 2021-05-29 DIAGNOSIS — C50511 Malignant neoplasm of lower-outer quadrant of right female breast: Secondary | ICD-10-CM | POA: Insufficient documentation

## 2021-05-29 DIAGNOSIS — Z170 Estrogen receptor positive status [ER+]: Secondary | ICD-10-CM | POA: Insufficient documentation

## 2021-05-29 LAB — CBC
HCT: 41.5 % (ref 36.0–46.0)
Hemoglobin: 14 g/dL (ref 12.0–15.0)
MCH: 29.5 pg (ref 26.0–34.0)
MCHC: 33.7 g/dL (ref 30.0–36.0)
MCV: 87.4 fL (ref 80.0–100.0)
Platelets: 268 10*3/uL (ref 150–400)
RBC: 4.75 MIL/uL (ref 3.87–5.11)
RDW: 13.2 % (ref 11.5–15.5)
WBC: 6.3 10*3/uL (ref 4.0–10.5)
nRBC: 0 % (ref 0.0–0.2)

## 2021-05-30 ENCOUNTER — Ambulatory Visit
Admission: RE | Admit: 2021-05-30 | Discharge: 2021-05-30 | Disposition: A | Discharge status: Home / Self Care | Payer: No Typology Code available for payment source | Source: Ambulatory Visit | Attending: Radiation Oncology | Admitting: Radiation Oncology

## 2021-05-30 DIAGNOSIS — Z510 Encounter for antineoplastic radiation therapy: Secondary | ICD-10-CM | POA: Diagnosis not present

## 2021-05-31 ENCOUNTER — Ambulatory Visit
Admission: RE | Admit: 2021-05-31 | Discharge: 2021-05-31 | Disposition: A | Discharge status: Home / Self Care | Payer: No Typology Code available for payment source | Source: Ambulatory Visit | Attending: Radiation Oncology | Admitting: Radiation Oncology

## 2021-05-31 DIAGNOSIS — Z510 Encounter for antineoplastic radiation therapy: Secondary | ICD-10-CM | POA: Diagnosis not present

## 2021-06-02 ENCOUNTER — Other Ambulatory Visit: Payer: Self-pay | Admitting: Internal Medicine

## 2021-06-03 ENCOUNTER — Ambulatory Visit
Admission: RE | Admit: 2021-06-03 | Discharge: 2021-06-03 | Disposition: A | Discharge status: Home / Self Care | Payer: No Typology Code available for payment source | Source: Ambulatory Visit | Attending: Radiation Oncology | Admitting: Radiation Oncology

## 2021-06-03 DIAGNOSIS — Z510 Encounter for antineoplastic radiation therapy: Secondary | ICD-10-CM | POA: Diagnosis not present

## 2021-06-03 MED ORDER — ZOLPIDEM TARTRATE 5 MG PO TABS: 5.0000 mg | ORAL_TABLET | Freq: Every evening | ORAL | 0 refills | Status: DC | PRN

## 2021-06-04 ENCOUNTER — Ambulatory Visit
Admission: RE | Admit: 2021-06-04 | Discharge: 2021-06-04 | Disposition: A | Discharge status: Home / Self Care | Payer: No Typology Code available for payment source | Source: Ambulatory Visit | Attending: Radiation Oncology | Admitting: Radiation Oncology

## 2021-06-04 ENCOUNTER — Inpatient Hospital Stay: Payer: No Typology Code available for payment source

## 2021-06-04 ENCOUNTER — Inpatient Hospital Stay: Payer: No Typology Code available for payment source | Admitting: Licensed Clinical Social Worker

## 2021-06-04 DIAGNOSIS — Z510 Encounter for antineoplastic radiation therapy: Secondary | ICD-10-CM | POA: Diagnosis not present

## 2021-06-05 ENCOUNTER — Ambulatory Visit
Admission: RE | Admit: 2021-06-05 | Discharge: 2021-06-05 | Disposition: A | Discharge status: Home / Self Care | Payer: No Typology Code available for payment source | Source: Ambulatory Visit | Attending: Radiation Oncology | Admitting: Radiation Oncology

## 2021-06-05 DIAGNOSIS — Z510 Encounter for antineoplastic radiation therapy: Secondary | ICD-10-CM | POA: Diagnosis not present

## 2021-06-06 ENCOUNTER — Ambulatory Visit
Admission: RE | Admit: 2021-06-06 | Discharge: 2021-06-06 | Disposition: A | Discharge status: Home / Self Care | Payer: No Typology Code available for payment source | Source: Ambulatory Visit | Attending: Radiation Oncology | Admitting: Radiation Oncology

## 2021-06-06 DIAGNOSIS — Z510 Encounter for antineoplastic radiation therapy: Secondary | ICD-10-CM | POA: Diagnosis not present

## 2021-06-07 ENCOUNTER — Ambulatory Visit
Admission: RE | Admit: 2021-06-07 | Discharge: 2021-06-07 | Disposition: A | Discharge status: Home / Self Care | Payer: No Typology Code available for payment source | Source: Ambulatory Visit | Attending: Radiation Oncology | Admitting: Radiation Oncology

## 2021-06-07 DIAGNOSIS — Z510 Encounter for antineoplastic radiation therapy: Secondary | ICD-10-CM | POA: Diagnosis not present

## 2021-06-10 ENCOUNTER — Ambulatory Visit
Admission: RE | Admit: 2021-06-10 | Discharge: 2021-06-10 | Disposition: A | Discharge status: Home / Self Care | Payer: No Typology Code available for payment source | Source: Ambulatory Visit | Attending: Radiation Oncology | Admitting: Radiation Oncology

## 2021-06-10 DIAGNOSIS — Z510 Encounter for antineoplastic radiation therapy: Secondary | ICD-10-CM | POA: Diagnosis not present

## 2021-06-10 NOTE — Progress Notes (Signed)
?[place]  ?Telephone:(336) [phone] [phone] ? ?ID: Nolon Stalls OB: 11-04-1960  MR#: [id number]  JYN#:[id number] ? ?Patient Care Team: ?Jearld Fenton, NP as PCP - General (Internal Medicine) ?Lloyd Huger, MD as Consulting Physician (Oncology) ?Robert Bellow, MD as Consulting Physician (General Surgery) ?Noreene Filbert, MD as Consulting Physician (Radiation Oncology) ?Rico Junker, RN as Oncology Nurse Navigator ? ?CHIEF COMPLAINT: Pathologic stage Ia ER/PR positive, HER-2 negative invasive carcinoma of the lower outer quadrant of the right breast.  Oncotype score 13, low risk. ? ?INTERVAL HISTORY: Patient returns to clinic today at the end of her XRT for further evaluation and initiation of letrozole.  She tolerated treatment well only with some mild skin irritation on her right breast.  She otherwise feels well and is asymptomatic. She has no neurologic complaints.  She denies any recent fevers or illnesses.  She has a good appetite and denies weight loss.  She has no chest pain, shortness of breath, cough, or hemoptysis.  She denies any nausea, vomiting, constipation, or diarrhea.  She has no urinary complaints.  Patient offers no further specific complaints today. ? ?REVIEW OF SYSTEMS:   ?Review of Systems  ?Constitutional: Negative.  Negative for fever, malaise/fatigue and weight loss.  ?Respiratory: Negative.  Negative for cough, hemoptysis and shortness of breath.   ?Cardiovascular: Negative.  Negative for chest pain and leg swelling.  ?Gastrointestinal: Negative.  Negative for abdominal pain.  ?Genitourinary: Negative.  Negative for dysuria.  ?Musculoskeletal: Negative.  Negative for back pain.  ?Skin: Negative.  Negative for rash.  ?Neurological: Negative.  Negative for dizziness, focal weakness, weakness and headaches.  ?Psychiatric/Behavioral:  The patient is nervous/anxious.   ? ?As per HPI. Otherwise, a complete review of systems is  negative. ? ?PAST MEDICAL HISTORY: ?Past Medical History:  ?Diagnosis Date  ? Adenomatous polyp 2019  ? Anxiety   ? Breast cancer ([place]) 03/22/2021  ? Chicken pox   ? Depression   ? GERD (gastroesophageal reflux disease)   ? ? ?PAST SURGICAL HISTORY: ?Past Surgical History:  ?Procedure Laterality Date  ? BREAST BIOPSY Left 2010  ? biopsy of a lump  ? BREAST LUMPECTOMY WITH SENTINEL LYMPH NODE BIOPSY Right 04/10/2021  ? Procedure: BREAST LUMPECTOMY WITH SENTINEL LYMPH NODE BX;  Surgeon: Robert Bellow, MD;  Location: [facility] ORS;  Service: General;  Laterality: Right;  ? [place]  ? COLONOSCOPY  2019  ? Adenomatous Polyp: CBF 12/2017: Recall ltr mailed 12/11/17  ? TUBAL LIGATION  2000  ? ? ?FAMILY HISTORY: ?Family History  ?Problem Relation Age of Onset  ? Hyperlipidemia Mother   ? Alzheimer's disease Mother   ? Hypertension Father   ? Heart disease Father   ? Lung cancer Paternal Grandfather   ? Melanoma Brother   ? Breast cancer Neg Hx   ? Stroke Neg Hx   ? ? ?ADVANCED DIRECTIVES (Y/N):  N ? ?HEALTH MAINTENANCE: ?Social History  ? ?Tobacco Use  ? Smoking status: Former  ?  Packs/day: 0.50  ?  Years: 30.00  ?  Pack years: 15.00  ?  Types: Cigarettes  ?  Quit date: 04/10/2014  ?  Years since quitting: 7.1  ? Smokeless tobacco: Never  ?Vaping Use  ? Vaping Use: Never used  ?Substance Use Topics  ? Alcohol use: Yes  ?  Alcohol/week: 1.0 standard drink  ?  Types: 1 Glasses of wine per week  ?  Comment: on special occassions  ?  Drug use: No  ? ? ? Colonoscopy: ? PAP: ? Bone density: ? Lipid panel: ? ?No Known Allergies ? ?Current Outpatient Medications  ?Medication Sig Dispense Refill  ? zolpidem (AMBIEN) 5 MG tablet Take 1 tablet (5 mg total) by mouth at bedtime as needed for sleep. 30 tablet 0  ? ?No current facility-administered medications for this visit.  ? ? ?OBJECTIVE: ?Vitals:  ? 06/11/21 1424  ?BP: 135/73  ?Pulse: 87  ?Resp: 16  ?Temp: 98.2 ?F (36.8 ?C)  ?SpO2: 99%  ?   Body mass index is 25.29  kg/m?Marland Kitchen    ECOG FS:0 - Asymptomatic ? ?General: Well-developed, well-nourished, no acute distress. ?Eyes: Pink conjunctiva, anicteric sclera. ?HEENT: Normocephalic, moist mucous membranes. ?Breast: Right breast with mild erythema. ?Lungs: No audible wheezing or coughing. ?Heart: Regular rate and rhythm. ?Abdomen: Soft, nontender, no obvious distention. ?Musculoskeletal: No edema, cyanosis, or clubbing. ?Neuro: Alert, answering all questions appropriately. Cranial nerves grossly intact. ?Skin: No rashes or petechiae noted. ?Psych: Normal affect. ? ?LAB RESULTS: ? ?Lab Results  ?Component Value Date  ? NA 139 12/05/2020  ? K 4.5 12/05/2020  ? CL 104 12/05/2020  ? CO2 28 12/05/2020  ? GLUCOSE 85 12/05/2020  ? BUN 15 12/05/2020  ? CREATININE 0.83 12/05/2020  ? CALCIUM 9.5 12/05/2020  ? PROT 6.9 12/05/2020  ? ALBUMIN 4.3 10/17/2015  ? AST 14 12/05/2020  ? ALT 14 12/05/2020  ? ALKPHOS 38 (L) 10/17/2015  ? BILITOT 1.1 12/05/2020  ? GFRNONAA 78 11/04/2019  ? GFRAA 90 11/04/2019  ? ? ?Lab Results  ?Component Value Date  ? WBC 6.3 05/29/2021  ? NEUTROABS 9,562 11/04/2019  ? HGB 14.0 05/29/2021  ? HCT 41.5 05/29/2021  ? MCV 87.4 05/29/2021  ? PLT 268 05/29/2021  ? ? ? ?STUDIES: ?No results found. ? ?ASSESSMENT: Pathologic stage Ia ER/PR positive, HER-2 negative invasive carcinoma of the lower outer quadrant of the right breast.  Oncotype DX score 13, low risk. ? ?PLAN:   ? ?Pathologic stage Ia ER/PR positive, HER-2 negative invasive carcinoma of the lower outer quadrant of the right breast: Patient underwent lumpectomy on April 10, 2021 confirming stage of disease.  Oncotype score was low risk, therefore chemotherapy not indicated.  Patient completed XRT on June 11, 2021.  She is given a prescription for letrozole which is recommended to take for 5 years completing treatment in April 2028.  No further intervention is needed at this time.  Return to clinic in 3 months for routine evaluation.  ?Osteopenia: Patient underwent  bone mineral density in October 2020 which revealed a T score of -1.8.  We will get a repeat bone mineral density in the next 1 to 2 weeks to assess for interval change. ? ?Patient expressed understanding and was in agreement with this plan. She also understands that She can call clinic at any time with any questions, concerns, or complaints.  ? ? Cancer Staging  ?Carcinoma of lower outer quadrant of right breast ([place]) ?Staging form: Breast, AJCC 8th Edition ?- Clinical stage from 04/01/2021: Stage IA (cT1b, cN0, cM0, G2, ER+, PR+, HER2-) - Signed by Lloyd Huger, MD on 04/03/2021 ?Stage prefix: Initial diagnosis ?Histologic grading system: 3 grade system ? ? ?Lloyd Huger, MD   06/11/2021 3:42 PM ? ? ? ? ?

## 2021-06-11 ENCOUNTER — Other Ambulatory Visit: Payer: Self-pay

## 2021-06-11 ENCOUNTER — Encounter: Payer: Self-pay | Admitting: Emergency Medicine

## 2021-06-11 ENCOUNTER — Ambulatory Visit
Admission: RE | Admit: 2021-06-11 | Discharge: 2021-06-11 | Disposition: A | Discharge status: Home / Self Care | Payer: No Typology Code available for payment source | Source: Ambulatory Visit | Attending: Radiation Oncology | Admitting: Radiation Oncology

## 2021-06-11 ENCOUNTER — Inpatient Hospital Stay (HOSPITAL_BASED_OUTPATIENT_CLINIC_OR_DEPARTMENT_OTHER): Payer: No Typology Code available for payment source | Admitting: Oncology

## 2021-06-11 VITALS — BP 135/73 | HR 87 | Temp 98.2°F | Resp 16 | Ht 66.0 in | Wt 156.7 lb

## 2021-06-11 DIAGNOSIS — C50511 Malignant neoplasm of lower-outer quadrant of right female breast: Secondary | ICD-10-CM | POA: Diagnosis not present

## 2021-06-11 DIAGNOSIS — Z79811 Long term (current) use of aromatase inhibitors: Secondary | ICD-10-CM | POA: Diagnosis not present

## 2021-06-11 DIAGNOSIS — Z510 Encounter for antineoplastic radiation therapy: Secondary | ICD-10-CM | POA: Diagnosis not present

## 2021-06-11 LAB — RAD ONC ARIA SESSION SUMMARY
Course Elapsed Days: 28
Plan Fractions Treated to Date: 5
Plan Prescribed Dose Per Fraction: 2 Gy
Plan Total Fractions Prescribed: 5
Plan Total Prescribed Dose: 10 Gy
Reference Point Dosage Given to Date: 52.56 Gy
Reference Point Session Dosage Given: 2 Gy
Session Number: 21

## 2021-06-12 ENCOUNTER — Other Ambulatory Visit: Payer: Self-pay | Admitting: Oncology

## 2021-06-12 ENCOUNTER — Ambulatory Visit: Payer: No Typology Code available for payment source | Admitting: Oncology

## 2021-06-12 MED ORDER — LETROZOLE 2.5 MG PO TABS
2.5000 mg | ORAL_TABLET | Freq: Every day | ORAL | 3 refills | Status: DC
  Filled 2021-07-18: qty 90, 90d supply, fill #0
  Filled 2021-10-16: qty 90, 90d supply, fill #1
  Filled 2022-02-03: qty 60, 60d supply, fill #2

## 2021-06-25 ENCOUNTER — Inpatient Hospital Stay: Payer: No Typology Code available for payment source

## 2021-06-25 ENCOUNTER — Inpatient Hospital Stay: Payer: No Typology Code available for payment source | Attending: Oncology | Admitting: Licensed Clinical Social Worker

## 2021-06-25 ENCOUNTER — Encounter: Payer: Self-pay | Admitting: Licensed Clinical Social Worker

## 2021-06-25 DIAGNOSIS — Z808 Family history of malignant neoplasm of other organs or systems: Secondary | ICD-10-CM

## 2021-06-25 DIAGNOSIS — Z801 Family history of malignant neoplasm of trachea, bronchus and lung: Secondary | ICD-10-CM | POA: Diagnosis not present

## 2021-06-25 DIAGNOSIS — C50511 Malignant neoplasm of lower-outer quadrant of right female breast: Secondary | ICD-10-CM | POA: Diagnosis not present

## 2021-06-25 NOTE — Progress Notes (Signed)
REFERRING PROVIDER: ?Lloyd Huger, MD ?[address] [place] ?[place],  [place] [address] ? ?PRIMARY PROVIDER:  ?Jearld Fenton, NP ? ?PRIMARY REASON FOR VISIT:  ?1. Carcinoma of lower-outer quadrant of right female breast, unspecified estrogen receptor status ([place])   ?2. Family history of melanoma   ?3. Family history of lung cancer   ? ? ? ?HISTORY OF PRESENT ILLNESS:   ?[name], a 61 y.o. female, was seen for a [place] cancer genetics consultation at the request of Dr. Grayland Ormond due to a personal and family history of cancer.  [name] presents to clinic today to discuss the possibility of a hereditary predisposition to cancer, genetic testing, and to further clarify her future cancer risks, as well as potential cancer risks for family members.  ? ?In 2023, at the age of 69, [name] was diagnosed with invasive mammary carcinoma of the right breast, ER/PR+, HER2-. The treatment plan included lumpectomy in February 2023 and adjuvant radiation.  ? ? ?CANCER HISTORY:  ?Oncology History  ?Carcinoma of lower outer quadrant of right breast ([place])  ?04/01/2021 Initial Diagnosis  ? Carcinoma of lower outer quadrant of right breast ([place]) ? ?  ?04/01/2021 Cancer Staging  ? Staging form: Breast, AJCC 8th Edition ?- Clinical stage from 04/01/2021: Stage IA (cT1b, cN0, cM0, G2, ER+, PR+, HER2-) - Signed by Lloyd Huger, MD on 04/03/2021 ?Stage prefix: Initial diagnosis ?Histologic grading system: 3 grade system ? ?  ? ? ? ?RISK FACTORS:  ?Menarche was at age 48.  ?First live birth at age 89.  ?OCP use for approximately 10 years.  ?Ovaries intact: yes.  ?Hysterectomy: no.  ?Menopausal status: postmenopausal.  ?Colonoscopy: yes; normal. ? ?Past Medical History:  ?Diagnosis Date  ? Adenomatous polyp 2019  ? Anxiety   ? Breast cancer ([place]) 03/22/2021  ? Chicken pox   ? Depression   ? GERD (gastroesophageal reflux disease)   ? ? ?Past Surgical History:  ?Procedure Laterality Date  ? BREAST BIOPSY Left 2010  ? biopsy of  a lump  ? BREAST LUMPECTOMY WITH SENTINEL LYMPH NODE BIOPSY Right 04/10/2021  ? Procedure: BREAST LUMPECTOMY WITH SENTINEL LYMPH NODE BX;  Surgeon: Robert Bellow, MD;  Location: [facility] ORS;  Service: General;  Laterality: Right;  ? [place]  ? COLONOSCOPY  2019  ? Adenomatous Polyp: [facility] 12/2017: Recall ltr mailed 12/11/17  ? TUBAL LIGATION  2000  ? ?  ? ?FAMILY HISTORY:  ?We obtained a detailed, 4-generation family history.  Significant diagnoses are listed below: ?Family History  ?Problem Relation Age of Onset  ? Hyperlipidemia Mother   ? Alzheimer's disease Mother   ? Hypertension Father   ? Heart disease Father   ? Lung cancer Paternal Grandfather   ? Melanoma Brother   ? Breast cancer Neg Hx   ? Stroke Neg Hx   ? ?[name] has 1 daughter, 46. She has 2 grandsons. She has 1 brother and 3 sisters. Her brother had melanoma at 23 and had it removed, he is living at 56.  ? ?[name]'s mother died at 61. No known cancers on her side of the family. ? ?[name]'s father died at 77 and had history of skin cancer. Paternal grandfather had lung cancer at 73. Grandmother passed in her 75s as well.  ? ?[name] is unaware of previous family history of genetic testing for hereditary cancer risks. Patient's maternal ancestors are of unknown descent, and paternal ancestors are of unknown  descent. There is no reported Ashkenazi Jewish ancestry. There is no known consanguinity. ? ? ?GENETIC COUNSELING ASSESSMENT: [name] is a 61 y.o. female with a personal history of breast cancer which is somewhat suggestive of a hereditary cancer syndrome and predisposition to cancer. We, therefore, discussed and recommended the following at today's visit.  ? ?DISCUSSION: We discussed that approximately 10% of breast cancer is hereditary. Most cases of hereditary breast cancer are associated with BRCA1/BRCA2 genes, although there are other genes associated with hereditary cancer as well. Cancers and risks are  gene specific. We discussed that testing is beneficial for several reasons including knowing about cancer risks, identifying potential screening and risk-reduction options that may be appropriate, and to understand if other family members could be at risk for cancer and allow them to undergo genetic testing.  ? ?We reviewed the characteristics, features and inheritance patterns of hereditary cancer syndromes. We also discussed genetic testing, including the appropriate family members to test, the process of testing, insurance coverage and turn-around-time for results. We discussed the implications of a negative, positive and/or variant of uncertain significant result. We recommended [name] pursue genetic testing for the [facility] CancerNext-Expanded+RNA gene panel.  ? ?The CancerNext-Expanded + RNAinsight gene panel offered by [facility] and includes sequencing and rearrangement analysis for the following 77 genes: IP, ALK, APC*, ATM*, AXIN2, BAP1, BARD1, BLM, BMPR1A, BRCA1*, BRCA2*, BRIP1*, CDC73, CDH1*,CDK4, CDKN1B, CDKN2A, CHEK2*, CTNNA1, DICER1, FANCC, FH, FLCN, GALNT12, KIF1B, LZTR1, MAX, MEN1, MET, MLH1*, MSH2*, MSH3, MSH6*, MUTYH*, NBN, NF1*, NF2, NTHL1, PALB2*, PHOX2B, PMS2*, POT1, PRKAR1A, PTCH1, PTEN*, RAD51C*, RAD51D*,RB1, RECQL, RET, SDHA, SDHAF2, SDHB, SDHC, SDHD, SMAD4, SMARCA4, SMARCB1, SMARCE1, STK11, SUFU, TMEM127, TP53*,TSC1, TSC2, VHL and XRCC2 (sequencing and deletion/duplication); EGFR, EGLN1, HOXB13, KIT, MITF, PDGFRA, POLD1 and POLE (sequencing only); EPCAM and GREM1 (deletion/duplication only). ? ?Based on [name]'s personal and family history of cancer, she meets medical criteria for genetic testing. Despite that she meets criteria, she may still have an out of pocket cost. We discussed that if her out of pocket cost for testing is over $100, the laboratory will call and confirm whether she wants to proceed with testing.  If the out of pocket cost of testing is less than $100 she  will be billed by the genetic testing laboratory.  ? ?PLAN: After considering the risks, benefits, and limitations, [name] provided informed consent to pursue genetic testing and the blood sample was sent to [facility] for analysis of the CancerNext-Expanded+RNA panel. Results should be available within approximately 2-3 weeks' time, at which point they will be disclosed by telephone to [name], as will any additional recommendations warranted by these results. [name] will receive a summary of her genetic counseling visit and a copy of her results once available. This information will also be available in Epic.  ? ?[name]'s questions were answered to her satisfaction today. Our contact information was provided should additional questions or concerns arise. Thank you for the referral and allowing [place] to share in the care of your patient.  ? ?Faith Rogue, MS, LCGC ?Genetic Counselor ?[email or web address] ?Phone: [phone] ? ?The patient was seen for a total of 25 minutes in face-to-face genetic counseling.  Dr. Grayland Ormond was available for discussion regarding this case.  ? ?_______________________________________________________________________ ?For Office Staff:  ?Number of people involved in session: 1 ?Was an Intern/ student involved with case: no ? ?

## 2021-07-01 ENCOUNTER — Other Ambulatory Visit: Payer: No Typology Code available for payment source

## 2021-07-02 ENCOUNTER — Other Ambulatory Visit: Payer: Self-pay | Admitting: Internal Medicine

## 2021-07-03 MED ORDER — ZOLPIDEM TARTRATE 5 MG PO TABS: 5.0000 mg | ORAL_TABLET | Freq: Every evening | ORAL | 0 refills | Status: DC | PRN

## 2021-07-09 ENCOUNTER — Encounter: Payer: Self-pay | Admitting: Licensed Clinical Social Worker

## 2021-07-09 ENCOUNTER — Ambulatory Visit: Payer: Self-pay | Admitting: Licensed Clinical Social Worker

## 2021-07-09 ENCOUNTER — Telehealth: Payer: Self-pay | Admitting: Licensed Clinical Social Worker

## 2021-07-09 DIAGNOSIS — Z1379 Encounter for other screening for genetic and chromosomal anomalies: Secondary | ICD-10-CM

## 2021-07-09 HISTORY — DX: Encounter for other screening for genetic and chromosomal anomalies: Z13.79

## 2021-07-09 NOTE — Telephone Encounter (Signed)
Revealed negative genetic testing.   This normal result is reassuring and indicates that it is unlikely Heather Jensen's cancer is due to a hereditary cause.  It is unlikely that there is an increased risk of another cancer due to a mutation in one of these genes.  However, genetic testing is not perfect, and cannot definitively rule out a hereditary cause.  It will be important for her to keep in contact with genetics to learn if any additional testing may be needed in the future.    ? ?

## 2021-07-09 NOTE — Progress Notes (Signed)
HPI:  Ms. Finan was previously seen in the [facility] due to a personal and family history of cancer and concerns regarding a hereditary predisposition to cancer. Please refer to our prior cancer genetics clinic note for more information regarding our discussion, assessment and recommendations, at the time. Ms. Kelso recent genetic test results were disclosed to her, as were recommendations warranted by these results. These results and recommendations are discussed in more detail below. ? ?CANCER HISTORY:  ?Oncology History  ?Carcinoma of lower outer quadrant of right breast ([place])  ?04/01/2021 Initial Diagnosis  ? Carcinoma of lower outer quadrant of right breast ([place]) ? ?  ?04/01/2021 Cancer Staging  ? Staging form: Breast, AJCC 8th Edition ?- Clinical stage from 04/01/2021: Stage IA (cT1b, cN0, cM0, G2, ER+, PR+, HER2-) - Signed by Lloyd Huger, MD on 04/03/2021 ?Stage prefix: Initial diagnosis ?Histologic grading system: 3 grade system ? ?  ? Genetic Testing  ? Negative genetic testing. No pathogenic variants identified on the [facility] CancerNext-Expanded+RNA panel. The report date is 07/08/2021. ? ?The CancerNext-Expanded + RNAinsight gene panel offered by [facility] and includes sequencing and rearrangement analysis for the following 77 genes: IP, ALK, APC*, ATM*, AXIN2, BAP1, BARD1, BLM, BMPR1A, BRCA1*, BRCA2*, BRIP1*, CDC73, CDH1*,CDK4, CDKN1B, CDKN2A, CHEK2*, CTNNA1, DICER1, FANCC, FH, FLCN, GALNT12, KIF1B, LZTR1, MAX, MEN1, MET, MLH1*, MSH2*, MSH3, MSH6*, MUTYH*, NBN, NF1*, NF2, NTHL1, PALB2*, PHOX2B, PMS2*, POT1, PRKAR1A, PTCH1, PTEN*, RAD51C*, RAD51D*,RB1, RECQL, RET, SDHA, SDHAF2, SDHB, SDHC, SDHD, SMAD4, SMARCA4, SMARCB1, SMARCE1, STK11, SUFU, TMEM127, TP53*,TSC1, TSC2, VHL and XRCC2 (sequencing and deletion/duplication); EGFR, EGLN1, HOXB13, KIT, MITF, PDGFRA, POLD1 and POLE (sequencing only); EPCAM and GREM1 (deletion/duplication only). ?  ? ? ?FAMILY HISTORY:  ?We  obtained a detailed, 4-generation family history.  Significant diagnoses are listed below: ?Family History  ?Problem Relation Age of Onset  ? Hyperlipidemia Mother   ? Alzheimer's disease Mother   ? Hypertension Father   ? Heart disease Father   ? Melanoma Brother 67  ? Lung cancer Paternal Grandfather   ? Breast cancer Neg Hx   ? Stroke Neg Hx   ? ? ?Ms. Sieg has 1 daughter, 24. She has 2 grandsons. She has 1 brother and 3 sisters. Her brother had melanoma at 12 and had it removed, he is living at 53.  ?  ?Ms. Harkey's mother died at 55. No known cancers on her side of the family. ?  ?Ms. Abramo's father died at 46 and had history of skin cancer. Paternal grandfather had lung cancer at 71. Grandmother passed in her 25s as well.  ?  ?Ms. Sporrer is unaware of previous family history of genetic testing for hereditary cancer risks. Patient's maternal ancestors are of unknown descent, and paternal ancestors are of unknown descent. There is no reported Ashkenazi Jewish ancestry. There is no known consanguinity. ? ?GENETIC TEST RESULTS: Genetic testing reported out on 07/08/2021 through the [facility] CancerNext-Expanded+RNA cancer panel found no pathogenic mutations.  ? ?The CancerNext-Expanded + RNAinsight gene panel offered by [employer] and includes sequencing and rearrangement analysis for the following 77 genes: IP, ALK, APC*, ATM*, AXIN2, BAP1, BARD1, BLM, BMPR1A, BRCA1*, BRCA2*, BRIP1*, CDC73, CDH1*,CDK4, CDKN1B, CDKN2A, CHEK2*, CTNNA1, DICER1, FANCC, FH, FLCN, GALNT12, KIF1B, LZTR1, MAX, MEN1, MET, MLH1*, MSH2*, MSH3, MSH6*, MUTYH*, NBN, NF1*, NF2, NTHL1, PALB2*, PHOX2B, PMS2*, POT1, PRKAR1A, PTCH1, PTEN*, RAD51C*, RAD51D*,RB1, RECQL, RET, SDHA, SDHAF2, SDHB, SDHC, SDHD, SMAD4, SMARCA4, SMARCB1, SMARCE1, STK11, SUFU, TMEM127, TP53*,TSC1, TSC2, VHL and XRCC2 (sequencing and deletion/duplication);  EGFR, EGLN1, HOXB13, KIT, MITF, PDGFRA, POLD1 and POLE (sequencing only); EPCAM and GREM1 (deletion/duplication  only).  ? ?The test report has been scanned into EPIC and is located under the Molecular Pathology section of the Results Review tab.  A portion of the result report is included below for reference.  ? ? ? ?We discussed that because current genetic testing is not perfect, it is possible there may be a gene mutation in one of these genes that current testing cannot detect, but that chance is small.  There could be another gene that has not yet been discovered, or that we have not yet tested, that is responsible for the cancer diagnoses in the family. It is also possible there is a hereditary cause for the cancer in the family that Ms. Bertha did not inherit and therefore was not identified in her testing.  Therefore, it is important to remain in touch with cancer genetics in the future so that we can continue to offer Ms. Interlaken the most up to date genetic testing.  ? ?ADDITIONAL GENETIC TESTING: We discussed with Ms. Winkels that her genetic testing was fairly extensive.  If there are genes identified to increase cancer risk that can be analyzed in the future, we would be happy to discuss and coordinate this testing at that time.   ? ?CANCER SCREENING RECOMMENDATIONS: Ms. Talsma test result is considered negative (normal).  This means that we have not identified a hereditary cause for her  personal and family history of cancer at this time. Most cancers happen by chance and this negative test suggests that her cancer may fall into this category.   ? ?While reassuring, this does not definitively rule out a hereditary predisposition to cancer. It is still possible that there could be genetic mutations that are undetectable by current technology. There could be genetic mutations in genes that have not been tested or identified to increase cancer risk.  Therefore, it is recommended she continue to follow the cancer management and screening guidelines provided by her oncology and primary healthcare provider.  ? ?An  individual's cancer risk and medical management are not determined by genetic test results alone. Overall cancer risk assessment incorporates additional factors, including personal medical history, family history, and any available genetic information that may result in a personalized plan for cancer prevention and surveillance. ? ?RECOMMENDATIONS FOR FAMILY MEMBERS:  Relatives in this family might be at some increased risk of developing cancer, over the general population risk, simply due to the family history of cancer.  We recommended female relatives in this family have a yearly mammogram beginning at age 60, or 64 years younger than the earliest onset of cancer, an annual clinical breast exam, and perform monthly breast self-exams. Female relatives in this family should also have a gynecological exam as recommended by their primary provider.  All family members should be referred for colonoscopy starting at age 71.  ? ?FOLLOW-UP: Lastly, we discussed with Ms. Tomasini that cancer genetics is a rapidly advancing field and it is possible that new genetic tests will be appropriate for her and/or her family members in the future. We encouraged her to remain in contact with cancer genetics on an annual basis so we can update her personal and family histories and let her know of advances in cancer genetics that may benefit this family.  ? ?Our contact number was provided. Ms. Pollino's questions were answered to her satisfaction, and she knows she is welcome to call us at anytime  with additional questions or concerns.  ? ?Faith Rogue, MS, LCGC ?Genetic Counselor ?[email or web address] ?Phone: [phone] ? ?

## 2021-07-15 ENCOUNTER — Ambulatory Visit
Admission: RE | Admit: 2021-07-15 | Discharge: 2021-07-15 | Disposition: A | Discharge status: Home / Self Care | Payer: No Typology Code available for payment source | Source: Ambulatory Visit | Attending: Oncology | Admitting: Oncology

## 2021-07-15 ENCOUNTER — Ambulatory Visit
Admission: RE | Admit: 2021-07-15 | Discharge: 2021-07-15 | Disposition: A | Discharge status: Home / Self Care | Payer: No Typology Code available for payment source | Source: Ambulatory Visit | Attending: Radiation Oncology | Admitting: Radiation Oncology

## 2021-07-15 ENCOUNTER — Encounter: Payer: Self-pay | Admitting: Radiation Oncology

## 2021-07-15 VITALS — BP 130/79 | HR 72 | Temp 98.2°F | Resp 16 | Ht 66.0 in | Wt 154.3 lb

## 2021-07-15 DIAGNOSIS — Z79811 Long term (current) use of aromatase inhibitors: Secondary | ICD-10-CM | POA: Diagnosis present

## 2021-07-15 DIAGNOSIS — M810 Age-related osteoporosis without current pathological fracture: Secondary | ICD-10-CM | POA: Diagnosis not present

## 2021-07-15 DIAGNOSIS — Z170 Estrogen receptor positive status [ER+]: Secondary | ICD-10-CM

## 2021-07-15 DIAGNOSIS — Z87310 Personal history of (healed) osteoporosis fracture: Secondary | ICD-10-CM | POA: Insufficient documentation

## 2021-07-15 NOTE — Progress Notes (Signed)
Radiation Oncology Follow up Note  Name: Heather Jensen [facility]   Date:   07/15/2021 MRN:  [id number] DOB: [birth year]    This 61 y.o. female presents to the clinic today for 1 month follow-up status post whole breast radiation to her right breast for stage Ia ER/PR positive invasive mammary carcinoma.  REFERRING PROVIDER: Jearld Fenton, NP  HPI: Patient is a 61 year old female now out 1 month having completed whole breast radiation.To her right breast for stage Ia ER/PR positive H of mammary carcinoma.  Seen today in routine follow-up she is doing well.  She specifically denies breast tenderness cough or bone pain.  She is currently on Femara tolerating it well without side effect.  COMPLICATIONS OF TREATMENT: none  FOLLOW UP COMPLIANCE: keeps appointments   PHYSICAL EXAM:  BP 130/79 (BP Location: Left Arm, Patient Position: Sitting, Cuff Size: Normal)   Pulse 72   Temp 98.2 F (36.8 C) (Tympanic)   Resp 16   Ht '5\' 6"'$  (1.676 m) Comment: stated wt  Wt 154 lb 4.8 oz (70 kg)   BMI 24.90 kg/m  Lungs are clear to A&P cardiac examination essentially unremarkable with regular rate and rhythm. No dominant mass or nodularity is noted in either breast in 2 positions examined. Incision is well-healed. No axillary or supraclavicular adenopathy is appreciated. Cosmetic result is excellent.  Well-developed well-nourished patient in NAD. HEENT reveals PERLA, EOMI, discs not visualized.  Oral cavity is clear. No oral mucosal lesions are identified. Neck is clear without evidence of cervical or supraclavicular adenopathy. Lungs are clear to A&P. Cardiac examination is essentially unremarkable with regular rate and rhythm without murmur rub or thrill. Abdomen is benign with no organomegaly or masses noted. Motor sensory and DTR levels are equal and symmetric in the upper and lower extremities. Cranial nerves II through XII are grossly intact. Proprioception is intact. No peripheral adenopathy or edema is  identified. No motor or sensory levels are noted. Crude visual fields are within normal range.  RADIOLOGY RESULTS: No current films to review  PLAN: Present time patient is at 1 month from whole breast radiation.  She is doing well very low side effect profile.  She continues on Femara without side effect.  Of asked to see her back in 5 months for follow-up.  Patient knows to call with any concerns.  I would like to take this opportunity to thank you for allowing me to participate in the care of your patient.Noreene Filbert, MD

## 2021-07-18 ENCOUNTER — Other Ambulatory Visit: Payer: Self-pay

## 2021-08-01 ENCOUNTER — Other Ambulatory Visit: Payer: Self-pay | Admitting: Internal Medicine

## 2021-08-01 MED ORDER — ZOLPIDEM TARTRATE 5 MG PO TABS: 5.0000 mg | ORAL_TABLET | Freq: Every evening | ORAL | 0 refills | Status: DC | PRN

## 2021-08-13 ENCOUNTER — Ambulatory Visit: Payer: No Typology Code available for payment source | Admitting: Oncology

## 2021-08-20 ENCOUNTER — Inpatient Hospital Stay: Payer: No Typology Code available for payment source | Attending: Oncology | Admitting: Oncology

## 2021-08-20 ENCOUNTER — Encounter: Payer: Self-pay | Admitting: Oncology

## 2021-08-20 VITALS — BP 114/74 | HR 68 | Temp 95.0°F | Resp 17 | Wt 152.0 lb

## 2021-08-20 DIAGNOSIS — Z79899 Other long term (current) drug therapy: Secondary | ICD-10-CM | POA: Diagnosis not present

## 2021-08-20 DIAGNOSIS — Z170 Estrogen receptor positive status [ER+]: Secondary | ICD-10-CM | POA: Diagnosis not present

## 2021-08-20 DIAGNOSIS — M8580 Other specified disorders of bone density and structure, unspecified site: Secondary | ICD-10-CM | POA: Diagnosis not present

## 2021-08-20 DIAGNOSIS — C50511 Malignant neoplasm of lower-outer quadrant of right female breast: Secondary | ICD-10-CM | POA: Insufficient documentation

## 2021-09-02 ENCOUNTER — Other Ambulatory Visit: Payer: Self-pay | Admitting: Internal Medicine

## 2021-09-02 MED ORDER — ZOLPIDEM TARTRATE 5 MG PO TABS: 5.0000 mg | ORAL_TABLET | Freq: Every evening | ORAL | 0 refills | Status: DC | PRN

## 2021-10-01 ENCOUNTER — Other Ambulatory Visit: Payer: Self-pay | Admitting: Internal Medicine

## 2021-10-02 MED ORDER — ZOLPIDEM TARTRATE 5 MG PO TABS: 5.0000 mg | ORAL_TABLET | Freq: Every evening | ORAL | 0 refills | Status: DC | PRN

## 2021-10-16 ENCOUNTER — Other Ambulatory Visit: Payer: Self-pay

## 2021-11-01 ENCOUNTER — Other Ambulatory Visit: Payer: Self-pay | Admitting: Internal Medicine

## 2021-11-04 ENCOUNTER — Other Ambulatory Visit: Payer: Self-pay | Admitting: Internal Medicine

## 2021-11-04 MED ORDER — ZOLPIDEM TARTRATE 5 MG PO TABS
5.0000 mg | ORAL_TABLET | Freq: Every evening | ORAL | 0 refills | Status: DC | PRN
Start: 2021-11-04 — End: 2021-12-03

## 2021-11-22 ENCOUNTER — Ambulatory Visit (INDEPENDENT_AMBULATORY_CARE_PROVIDER_SITE_OTHER): Payer: No Typology Code available for payment source | Admitting: Internal Medicine

## 2021-11-22 ENCOUNTER — Encounter: Payer: Self-pay | Admitting: Internal Medicine

## 2021-11-22 VITALS — BP 122/84 | HR 77 | Temp 96.9°F | Ht 66.5 in | Wt 150.0 lb

## 2021-11-22 DIAGNOSIS — Z0001 Encounter for general adult medical examination with abnormal findings: Secondary | ICD-10-CM

## 2021-11-22 NOTE — Progress Notes (Signed)
Subjective:    Patient ID: Heather Jensen, female    DOB: [birth year], 61 y.o.   MRN: [id number]  HPI  Patient presents to clinic today for her annual exam.  Flu: 11/2018 Tetanus: > 10 years ago COVID: Pfizer x1 Shingrix: 04/2018, 11/2018 Pap smear: 06/2017 Mammogram: 02/2021 Bone density: 06/2021 Colon screening: 12/2012 Vision screening: every 2 years Dentist: biannually  Diet: She does eat meat. She consumes fruits and veggies. She tries to avoid fried foods. She drinks mostly water and coffee. Exercise: Walking  Review of Systems     Past Medical History:  Diagnosis Date   Adenomatous polyp 2019   Anxiety    Breast cancer ([place]) 03/22/2021   Chicken pox    Depression    GERD (gastroesophageal reflux disease)     Current Outpatient Medications  Medication Sig Dispense Refill   Calcium-Magnesium-Vitamin D (CALCIUM 1200+D3 PO) Take by mouth.     escitalopram (LEXAPRO) 10 MG tablet Take 1 tablet by mouth daily. (Patient not taking: Reported on 08/20/2021)     letrozole (FEMARA) 2.5 MG tablet Take 1 tablet (2.5 mg total) by mouth daily. 90 tablet 3   zolpidem (AMBIEN) 5 MG tablet Take 1 tablet (5 mg total) by mouth at bedtime as needed for sleep. 30 tablet 0   No current facility-administered medications for this visit.    No Known Allergies  Family History  Problem Relation Age of Onset   Hyperlipidemia Mother    Alzheimer's disease Mother    Hypertension Father    Heart disease Father    Melanoma Brother 63   Lung cancer Paternal Grandfather    Breast cancer Neg Hx    Stroke Neg Hx     Social History   Socioeconomic History   Marital status: Married    Spouse name: Not on file   Number of children: 1   Years of education: Not on file   Highest education level: Bachelor's degree (e.g., BA, AB, BS)  Occupational History   Not on file  Tobacco Use   Smoking status: Former    Packs/day: 0.50    Years: 30.00    Total pack years: 15.00    Types:  Cigarettes    Quit date: 04/10/2014    Years since quitting: 7.6   Smokeless tobacco: Never  Vaping Use   Vaping Use: Never used  Substance and Sexual Activity   Alcohol use: Yes    Alcohol/week: 1.0 standard drink of alcohol    Types: 1 Glasses of wine per week    Comment: on special occassions   Drug use: No   Sexual activity: Yes    Partners: Male    Birth control/protection: None  Other Topics Concern   Not on file  Social History Narrative   Not on file   Social Determinants of Health   Financial Resource Strain: Low Risk  (04/10/2017)   Overall Financial Resource Strain (CARDIA)    Difficulty of Paying Living Expenses: Not hard at all  Food Insecurity: No Food Insecurity (04/10/2017)   Hunger Vital Sign    Worried About Running Out of Food in the Last Year: Never true    [place] in the Last Year: Never true  Transportation Needs: No Transportation Needs (04/10/2017)   PRAPARE - Hydrologist (Medical): No    Lack of Transportation (Non-Medical): No  Physical Activity: Sufficiently Active (04/10/2017)   Exercise Vital Sign    Days of  Exercise per Week: 6 days    Minutes of Exercise per Session: 40 min  Stress: No Stress Concern Present (04/10/2017)   [place]    Feeling of Stress : Only a little  Social Connections: Unknown (04/10/2017)   Social Connection and Isolation Panel [NHANES]    Frequency of Communication with Friends and Family: Patient refused    Frequency of Social Gatherings with Friends and Family: Patient refused    Attends Religious Services: Patient refused    Active Member of Clubs or Organizations: Patient refused    Attends Archivist Meetings: Patient refused    Marital Status: Patient refused  Intimate Partner Violence: Not At Risk (04/10/2017)   Humiliation, Afraid, Rape, and Kick questionnaire    Fear of Current or Ex-Partner: No     Emotionally Abused: No    Physically Abused: No    Sexually Abused: No     Constitutional: Denies fever, malaise, fatigue, headache or abrupt weight changes.  HEENT: Denies eye pain, eye redness, ear pain, ringing in the ears, wax buildup, runny nose, nasal congestion, bloody nose, or sore throat. Respiratory: Denies difficulty breathing, shortness of breath, cough or sputum production.   Cardiovascular: Denies chest pain, chest tightness, palpitations or swelling in the hands or feet.  Gastrointestinal: Patient reports constipation.  Denies abdominal pain, bloating, diarrhea or blood in the stool.  GU: Denies urgency, frequency, pain with urination, burning sensation, blood in urine, odor or discharge. Musculoskeletal: Denies decrease in range of motion, difficulty with gait, muscle pain or joint pain and swelling.  Skin: Denies redness, rashes, lesions or ulcercations.  Neurological: Patient reports insomnia.  Denies dizziness, difficulty with memory, difficulty with speech or problems with balance and coordination.  Psych: Patient has a history of anxiety.  Denies depression, SI/HI.  No other specific complaints in a complete review of systems (except as listed in HPI above).  Objective:   Physical Exam  BP 122/84 (BP Location: Left Arm, Patient Position: Sitting, Cuff Size: Normal)   Pulse 77   Temp (!) 96.9 F (36.1 C) (Temporal)   Ht 5' 6.5" (1.689 m)   Wt 150 lb (68 kg)   SpO2 99%   BMI 23.85 kg/m   Wt Readings from Last 3 Encounters:  08/20/21 152 lb (68.9 kg)  07/15/21 154 lb 4.8 oz (70 kg)  06/11/21 156 lb 11.2 oz (71.1 kg)    General: Appears her stated age, well developed, well nourished in NAD. Skin: Warm, dry and intact. HEENT: Head: normal shape and size; Eyes: sclera white, no icterus, conjunctiva pink, PERRLA and EOMs intact;  Neck:  Neck supple, trachea midline. No masses, lumps or thyromegaly present.  Cardiovascular: Normal rate and rhythm. S1,S2 noted.   No murmur, rubs or gallops noted. No JVD or BLE edema. No carotid bruits noted. Pulmonary/Chest: Normal effort and positive vesicular breath sounds. No respiratory distress. No wheezes, rales or ronchi noted.  Abdomen: Normal bowel sounds.  Musculoskeletal: Strength 5/5 BUE/BLE. No difficulty with gait.  Neurological: Alert and oriented. Cranial nerves II-XII grossly intact. Coordination normal.  Psychiatric: Mood and affect normal. Behavior is normal. Judgment and thought content normal.     BMET    Component Value Date/Time   NA 139 12/05/2020 0828   K 4.5 12/05/2020 0828   CL 104 12/05/2020 0828   CO2 28 12/05/2020 0828   GLUCOSE 85 12/05/2020 0828   BUN 15 12/05/2020 0828   CREATININE 0.83 12/05/2020  5830   CALCIUM 9.5 12/05/2020 0828   GFRNONAA 78 11/04/2019 0908   GFRAA 90 11/04/2019 0908    Lipid Panel     Component Value Date/Time   CHOL 181 12/05/2020 0828   TRIG 68 12/05/2020 0828   HDL 69 12/05/2020 0828   CHOLHDL 2.6 12/05/2020 0828   VLDL 20.8 10/17/2015 1412   LDLCALC 97 12/05/2020 0828    CBC    Component Value Date/Time   WBC 6.3 05/29/2021 1424   RBC 4.75 05/29/2021 1424   HGB 14.0 05/29/2021 1424   HCT 41.5 05/29/2021 1424   PLT 268 05/29/2021 1424   MCV 87.4 05/29/2021 1424   MCH 29.5 05/29/2021 1424   MCHC 33.7 05/29/2021 1424   RDW 13.2 05/29/2021 1424   LYMPHSABS 1,786 11/04/2019 0908   EOSABS 130 11/04/2019 0908   BASOSABS 19 11/04/2019 0908    Hgb A1C Lab Results  Component Value Date   HGBA1C 4.9 09/13/2018           Assessment & Plan:   Preventative Health Maintenance:  She declines Flu shot today She declines Tetanus today Encouraged her to get her COVID booster Shingrix vaccine UTD Pap smear UTD Mammogram and bone density UTD Colon screening UTD Encouraged her to consume a balanced diet and exercise regimen Advised to see an eye doctor and dentist annually We will check CBC, c-Met, lipid profile today  RTC in 6  months, follow-up chronic conditions Webb Silversmith, NP

## 2021-11-22 NOTE — Patient Instructions (Signed)
Health Maintenance for Postmenopausal Women Menopause is a normal process in which your ability to get pregnant comes to an end. This process happens slowly over many months or years, usually between the ages of 48 and 55. Menopause is complete when you have missed your menstrual period for 12 months. It is important to talk with your health care provider about some of the most common conditions that affect women after menopause (postmenopausal women). These include heart disease, cancer, and bone loss (osteoporosis). Adopting a healthy lifestyle and getting preventive care can help to promote your health and wellness. The actions you take can also lower your chances of developing some of these common conditions. What are the signs and symptoms of menopause? During menopause, you may have the following symptoms: Hot flashes. These can be moderate or severe. Night sweats. Decrease in sex drive. Mood swings. Headaches. Tiredness (fatigue). Irritability. Memory problems. Problems falling asleep or staying asleep. Talk with your health care provider about treatment options for your symptoms. Do I need hormone replacement therapy? Hormone replacement therapy is effective in treating symptoms that are caused by menopause, such as hot flashes and night sweats. Hormone replacement carries certain risks, especially as you become older. If you are thinking about using estrogen or estrogen with progestin, discuss the benefits and risks with your health care provider. How can I reduce my risk for heart disease and stroke? The risk of heart disease, heart attack, and stroke increases as you age. One of the causes may be a change in the body's hormones during menopause. This can affect how your body uses dietary fats, triglycerides, and cholesterol. Heart attack and stroke are medical emergencies. There are many things that you can do to help prevent heart disease and stroke. Watch your blood pressure High  blood pressure causes heart disease and increases the risk of stroke. This is more likely to develop in people who have high blood pressure readings or are overweight. Have your blood pressure checked: Every 3-5 years if you are 18-39 years of age. Every year if you are 40 years old or older. Eat a healthy diet  Eat a diet that includes plenty of vegetables, fruits, low-fat dairy products, and lean protein. Do not eat a lot of foods that are high in solid fats, added sugars, or sodium. Get regular exercise Get regular exercise. This is one of the most important things you can do for your health. Most adults should: Try to exercise for at least 150 minutes each week. The exercise should increase your heart rate and make you sweat (moderate-intensity exercise). Try to do strengthening exercises at least twice each week. Do these in addition to the moderate-intensity exercise. Spend less time sitting. Even light physical activity can be beneficial. Other tips Work with your health care provider to achieve or maintain a healthy weight. Do not use any products that contain nicotine or tobacco. These products include cigarettes, chewing tobacco, and vaping devices, such as e-cigarettes. If you need help quitting, ask your health care provider. Know your numbers. Ask your health care provider to check your cholesterol and your blood sugar (glucose). Continue to have your blood tested as directed by your health care provider. Do I need screening for cancer? Depending on your health history and family history, you may need to have cancer screenings at different stages of your life. This may include screening for: Breast cancer. Cervical cancer. Lung cancer. Colorectal cancer. What is my risk for osteoporosis? After menopause, you may be   at increased risk for osteoporosis. Osteoporosis is a condition in which bone destruction happens more quickly than new bone creation. To help prevent osteoporosis or  the bone fractures that can happen because of osteoporosis, you may take the following actions: If you are 19-50 years old, get at least 1,000 mg of calcium and at least 600 international units (IU) of vitamin D per day. If you are older than age 50 but younger than age 70, get at least 1,200 mg of calcium and at least 600 international units (IU) of vitamin D per day. If you are older than age 70, get at least 1,200 mg of calcium and at least 800 international units (IU) of vitamin D per day. Smoking and drinking excessive alcohol increase the risk of osteoporosis. Eat foods that are rich in calcium and vitamin D, and do weight-bearing exercises several times each week as directed by your health care provider. How does menopause affect my mental health? Depression may occur at any age, but it is more common as you become older. Common symptoms of depression include: Feeling depressed. Changes in sleep patterns. Changes in appetite or eating patterns. Feeling an overall lack of motivation or enjoyment of activities that you previously enjoyed. Frequent crying spells. Talk with your health care provider if you think that you are experiencing any of these symptoms. General instructions See your health care provider for regular wellness exams and vaccines. This may include: Scheduling regular health, dental, and eye exams. Getting and maintaining your vaccines. These include: Influenza vaccine. Get this vaccine each year before the flu season begins. Pneumonia vaccine. Shingles vaccine. Tetanus, diphtheria, and pertussis (Tdap) booster vaccine. Your health care provider may also recommend other immunizations. Tell your health care provider if you have ever been abused or do not feel safe at home. Summary Menopause is a normal process in which your ability to get pregnant comes to an end. This condition causes hot flashes, night sweats, decreased interest in sex, mood swings, headaches, or lack  of sleep. Treatment for this condition may include hormone replacement therapy. Take actions to keep yourself healthy, including exercising regularly, eating a healthy diet, watching your weight, and checking your blood pressure and blood sugar levels. Get screened for cancer and depression. Make sure that you are up to date with all your vaccines. This information is not intended to replace advice given to you by your health care provider. Make sure you discuss any questions you have with your health care provider. Document Revised: 07/02/2020 Document Reviewed: 07/02/2020 Elsevier Patient Education  2023 Elsevier Inc.  

## 2021-11-23 LAB — CBC
HCT: 42.4 % (ref 35.0–45.0)
Hemoglobin: 14.7 g/dL (ref 11.7–15.5)
MCH: 31.4 pg (ref 27.0–33.0)
MCHC: 34.7 g/dL (ref 32.0–36.0)
MCV: 90.6 fL (ref 80.0–100.0)
MPV: 10.9 fL (ref 7.5–12.5)
Platelets: 267 10*3/uL (ref 140–400)
RBC: 4.68 10*6/uL (ref 3.80–5.10)
RDW: 12.2 % (ref 11.0–15.0)
WBC: 5.3 10*3/uL (ref 3.8–10.8)

## 2021-11-23 LAB — LIPID PANEL
Cholesterol: 226 mg/dL — ABNORMAL HIGH (ref <200)
HDL: 71 mg/dL (ref >=50)
LDL Cholesterol (Calc): 139 mg/dL (calc) — ABNORMAL HIGH
Non-HDL Cholesterol (Calc): 155 mg/dL (calc) — ABNORMAL HIGH (ref <130)
Total CHOL/HDL Ratio: 3.2 (calc) (ref <5.0)
Triglycerides: 66 mg/dL (ref <150)

## 2021-11-23 LAB — COMPLETE METABOLIC PANEL WITH GFR
AG Ratio: 1.7 (calc) (ref 1.0–2.5)
ALT: 9 U/L (ref 6–29)
AST: 13 U/L (ref 10–35)
Albumin: 4.6 g/dL (ref 3.6–5.1)
Alkaline phosphatase (APISO): 37 U/L (ref 37–153)
BUN: 10 mg/dL (ref 7–25)
CO2: 27 mmol/L (ref 20–32)
Calcium: 10 mg/dL (ref 8.6–10.4)
Chloride: 102 mmol/L (ref 98–110)
Creat: 0.79 mg/dL (ref 0.50–1.05)
Globulin: 2.7 g/dL (calc) (ref 1.9–3.7)
Glucose, Bld: 93 mg/dL (ref 65–139)
Potassium: 4.2 mmol/L (ref 3.5–5.3)
Sodium: 137 mmol/L (ref 135–146)
Total Bilirubin: 0.9 mg/dL (ref 0.2–1.2)
Total Protein: 7.3 g/dL (ref 6.1–8.1)
eGFR: 86 mL/min/{1.73_m2} (ref >=60)

## 2021-11-24 ENCOUNTER — Encounter: Payer: Self-pay | Admitting: Internal Medicine

## 2021-11-24 DIAGNOSIS — E7800 Pure hypercholesterolemia, unspecified: Secondary | ICD-10-CM

## 2021-11-25 ENCOUNTER — Other Ambulatory Visit: Payer: Self-pay

## 2021-11-25 DIAGNOSIS — E7800 Pure hypercholesterolemia, unspecified: Secondary | ICD-10-CM | POA: Insufficient documentation

## 2021-11-25 MED ORDER — SIMVASTATIN 10 MG PO TABS
10.0000 mg | ORAL_TABLET | Freq: Every day | ORAL | 1 refills | Status: DC
Start: 2021-11-25 — End: 2022-02-19
  Filled 2021-11-25: qty 90, 90d supply, fill #0

## 2021-12-03 ENCOUNTER — Other Ambulatory Visit: Payer: Self-pay | Admitting: Internal Medicine

## 2021-12-03 MED ORDER — ZOLPIDEM TARTRATE 5 MG PO TABS: 5.0000 mg | ORAL_TABLET | Freq: Every evening | ORAL | 0 refills | Status: DC | PRN

## 2021-12-17 ENCOUNTER — Other Ambulatory Visit: Payer: Self-pay

## 2021-12-19 ENCOUNTER — Ambulatory Visit
Admission: RE | Admit: 2021-12-19 | Discharge: 2021-12-19 | Disposition: A | Discharge status: Home / Self Care | Payer: No Typology Code available for payment source | Source: Ambulatory Visit | Attending: Radiation Oncology | Admitting: Radiation Oncology

## 2021-12-19 ENCOUNTER — Other Ambulatory Visit: Payer: Self-pay | Admitting: *Deleted

## 2021-12-19 ENCOUNTER — Encounter: Payer: Self-pay | Admitting: Radiation Oncology

## 2021-12-19 VITALS — BP 136/74 | HR 67 | Temp 97.0°F | Wt 152.0 lb

## 2021-12-19 DIAGNOSIS — Z170 Estrogen receptor positive status [ER+]: Secondary | ICD-10-CM | POA: Diagnosis not present

## 2021-12-19 DIAGNOSIS — C50511 Malignant neoplasm of lower-outer quadrant of right female breast: Secondary | ICD-10-CM | POA: Diagnosis present

## 2021-12-19 DIAGNOSIS — Z79811 Long term (current) use of aromatase inhibitors: Secondary | ICD-10-CM | POA: Insufficient documentation

## 2021-12-19 DIAGNOSIS — Z923 Personal history of irradiation: Secondary | ICD-10-CM | POA: Diagnosis not present

## 2021-12-19 NOTE — Progress Notes (Signed)
Radiation Oncology Follow up Note  Name: Heather Jensen   Date:   12/19/2021 MRN:  [id number] DOB: Aug 26, 1960    This 61 y.o. female presents to the clinic today for 56-monthfollow-up status post whole breast radiation to her right breast for stage Ia ER positive invasive mammary carcinoma.  REFERRING PROVIDER: BJearld Fenton NP  HPI: Patient is a 61year old female now out 6 months having pleated whole breast radiation to her right breast for stage Ia ER positive invasive mammary carcinoma seen today in routine follow-up she is doing well she specifically denies breast tenderness cough or bone pain.  She had some slight retraction of the right breast although cosmetically it is still excellent..  She is currently on Femara tolerating it well.  She has not yet had a follow-up mammogram.  COMPLICATIONS OF TREATMENT: none  FOLLOW UP COMPLIANCE: keeps appointments   PHYSICAL EXAM:  BP 136/74   Pulse 67   Temp (!) 97 F (36.1 C)   Wt 152 lb (68.9 kg)   BMI 24.17 kg/m  Lungs are clear to A&P cardiac examination essentially unremarkable with regular rate and rhythm. No dominant mass or nodularity is noted in either breast in 2 positions examined. Incision is well-healed. No axillary or supraclavicular adenopathy is appreciated. Cosmetic result is excellent.  Well-developed well-nourished patient in NAD. HEENT reveals PERLA, EOMI, discs not visualized.  Oral cavity is clear. No oral mucosal lesions are identified. Neck is clear without evidence of cervical or supraclavicular adenopathy. Lungs are clear to A&P. Cardiac examination is essentially unremarkable with regular rate and rhythm without murmur rub or thrill. Abdomen is benign with no organomegaly or masses noted. Motor sensory and DTR levels are equal and symmetric in the upper and lower extremities. Cranial nerves II through XII are grossly intact. Proprioception is intact. No peripheral adenopathy or edema is identified. No motor  or sensory levels are noted. Crude visual fields are within normal range.  RADIOLOGY RESULTS: Diagnostic mammograms ordered  PLAN: Present time patient is doing well 6 months out with no evidence of disease she will see Byrnett 1 more time and ordering diagnostic mammograms prior to that visit.  I have asked to see her back in 6 months for follow-up.  She continues on Femara without side effect.  Patient knows to call with any concerns.  I would like to take this opportunity to thank you for allowing me to participate in the care of your patient..Noreene Filbert MD

## 2021-12-26 ENCOUNTER — Other Ambulatory Visit: Payer: Self-pay | Admitting: Radiation Oncology

## 2021-12-26 DIAGNOSIS — C50511 Malignant neoplasm of lower-outer quadrant of right female breast: Secondary | ICD-10-CM

## 2021-12-30 ENCOUNTER — Other Ambulatory Visit: Payer: Self-pay | Admitting: General Surgery

## 2021-12-30 DIAGNOSIS — C50511 Malignant neoplasm of lower-outer quadrant of right female breast: Secondary | ICD-10-CM

## 2022-01-01 ENCOUNTER — Other Ambulatory Visit: Payer: Self-pay | Admitting: Internal Medicine

## 2022-01-02 MED ORDER — ZOLPIDEM TARTRATE 5 MG PO TABS: 5.0000 mg | ORAL_TABLET | Freq: Every evening | ORAL | 0 refills | Status: DC | PRN

## 2022-01-23 ENCOUNTER — Other Ambulatory Visit: Payer: Self-pay | Admitting: General Surgery

## 2022-01-23 DIAGNOSIS — Z170 Estrogen receptor positive status [ER+]: Secondary | ICD-10-CM

## 2022-01-23 DIAGNOSIS — C50511 Malignant neoplasm of lower-outer quadrant of right female breast: Secondary | ICD-10-CM

## 2022-01-30 ENCOUNTER — Other Ambulatory Visit: Payer: Self-pay | Admitting: Internal Medicine

## 2022-01-31 MED ORDER — ZOLPIDEM TARTRATE 5 MG PO TABS: 5.0000 mg | ORAL_TABLET | Freq: Every evening | ORAL | 0 refills | Status: DC | PRN

## 2022-02-03 ENCOUNTER — Other Ambulatory Visit: Payer: Self-pay

## 2022-02-19 ENCOUNTER — Encounter: Payer: Self-pay | Admitting: Oncology

## 2022-02-19 ENCOUNTER — Other Ambulatory Visit: Payer: Self-pay

## 2022-02-19 ENCOUNTER — Telehealth: Payer: Self-pay | Admitting: *Deleted

## 2022-02-19 ENCOUNTER — Inpatient Hospital Stay: Payer: No Typology Code available for payment source | Attending: Oncology | Admitting: Oncology

## 2022-02-19 VITALS — BP 140/74 | HR 82 | Temp 98.3°F | Resp 18 | Wt 160.0 lb

## 2022-02-19 DIAGNOSIS — Z79811 Long term (current) use of aromatase inhibitors: Secondary | ICD-10-CM | POA: Diagnosis not present

## 2022-02-19 DIAGNOSIS — C50511 Malignant neoplasm of lower-outer quadrant of right female breast: Secondary | ICD-10-CM | POA: Insufficient documentation

## 2022-02-19 DIAGNOSIS — Z170 Estrogen receptor positive status [ER+]: Secondary | ICD-10-CM | POA: Diagnosis not present

## 2022-02-19 DIAGNOSIS — Z923 Personal history of irradiation: Secondary | ICD-10-CM | POA: Diagnosis not present

## 2022-02-19 DIAGNOSIS — M8580 Other specified disorders of bone density and structure, unspecified site: Secondary | ICD-10-CM | POA: Diagnosis not present

## 2022-02-19 MED ORDER — LETROZOLE 2.5 MG PO TABS
2.5000 mg | ORAL_TABLET | Freq: Every day | ORAL | 3 refills | Status: DC
  Filled 2022-02-19 – 2022-04-07 (×2): qty 90, 90d supply, fill #0
  Filled 2022-07-01: qty 90, 90d supply, fill #1
  Filled 2022-10-15: qty 90, 90d supply, fill #2
  Filled 2023-01-02: qty 90, 90d supply, fill #3

## 2022-02-19 NOTE — Progress Notes (Signed)
Here for Breast cancer. Denies hot flashes. Some breast tenderness in R breast and R shoulder. 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 Appetite is good. Energy is normal. Mammogram Jan 8th.

## 2022-02-19 NOTE — Progress Notes (Signed)
[facility]  [phone] [phone]  ID: Nolon Stalls OB: Jan 27, 1961  MR#: [id number]  NTI#:[id number]  Patient Care Team: Jearld Fenton, NP as PCP - General (Internal Medicine) Lloyd Huger, MD as Consulting Physician (Oncology) Bary Castilla, Forest Gleason, MD as Consulting Physician (General Surgery) Noreene Filbert, MD as Consulting Physician (Radiation Oncology) Rico Junker, RN as Oncology Nurse Navigator  CHIEF COMPLAINT: Pathologic stage Ia ER/PR positive, HER-2 negative invasive carcinoma of the lower outer quadrant of the right breast.  Oncotype score 13, low risk.  INTERVAL HISTORY: Patient returns to clinic today for routine 65-monthevaluation.  She continues to feel well and remains asymptomatic.  She is tolerating letrozole without significant side effects.  She has no neurologic complaints.  She denies any recent fevers or illnesses.  She has a good appetite and denies weight loss.  She has no chest pain, shortness of breath, cough, or hemoptysis.  She denies any nausea, vomiting, constipation, or diarrhea.  She has no urinary complaints.  Patient feels at her baseline and offers no specific complaints today.  REVIEW OF SYSTEMS:   Review of Systems  Constitutional: Negative.  Negative for fever, malaise/fatigue and weight loss.  Respiratory: Negative.  Negative for cough, hemoptysis and shortness of breath.   Cardiovascular: Negative.  Negative for chest pain and leg swelling.  Gastrointestinal: Negative.  Negative for abdominal pain.  Genitourinary: Negative.  Negative for dysuria.  Musculoskeletal: Negative.  Negative for back pain.  Skin: Negative.  Negative for rash.  Neurological: Negative.  Negative for dizziness, focal weakness, weakness and headaches.  Psychiatric/Behavioral: Negative.  The patient is not nervous/anxious.     As per HPI. Otherwise, a complete review of systems is negative.  PAST MEDICAL  HISTORY: Past Medical History:  Diagnosis Date   Adenomatous polyp 2019   Anxiety    Breast cancer ([place] 03/22/2021   Chicken pox    Depression    GERD (gastroesophageal reflux disease)     PAST SURGICAL HISTORY: Past Surgical History:  Procedure Laterality Date   BREAST BIOPSY Left 2010   biopsy of a lump   BREAST LUMPECTOMY WITH SENTINEL LYMPH NODE BIOPSY Right 04/10/2021   Procedure: BREAST LUMPECTOMY WITH SENTINEL LYMPH NODE BX;  Surgeon: BRobert Bellow MD;  Location: [facility] ORS;  Service: General;  Laterality: Right;   CBelen  COLONOSCOPY  2019   Adenomatous Polyp: [facility] 12/2017: Recall ltr mailed 12/11/17   TUBAL LIGATION  2000    FAMILY HISTORY: Family History  Problem Relation Age of Onset   Hyperlipidemia Mother    Alzheimer's disease Mother    Hypertension Father    Heart disease Father    Melanoma Brother 423  Lung cancer Paternal Grandfather    Breast cancer Neg Hx    Stroke Neg Hx     ADVANCED DIRECTIVES (Y/N):  N  HEALTH MAINTENANCE: Social History   Tobacco Use   Smoking status: Former    Packs/day: 0.50    Years: 30.00    Total pack years: 15.00    Types: Cigarettes    Quit date: 04/10/2014    Years since quitting: 7.8   Smokeless tobacco: Never  Vaping Use   Vaping Use: Never used  Substance Use Topics   Alcohol use: Yes    Alcohol/week: 1.0 standard drink of alcohol    Types: 1 Glasses of wine per week    Comment: on special occassions   Drug use: No  Colonoscopy:  PAP:  Bone density:  Lipid panel:  No Known Allergies  Current Outpatient Medications  Medication Sig Dispense Refill   Calcium-Magnesium-Vitamin D (CALCIUM 1200+D3 PO) Take by mouth.     letrozole (FEMARA) 2.5 MG tablet Take 1 tablet (2.5 mg total) by mouth daily. 90 tablet 3   zolpidem (AMBIEN) 5 MG tablet Take 1 tablet (5 mg total) by mouth at bedtime as needed for sleep. 30 tablet 0   No current facility-administered medications for this  visit.    OBJECTIVE: Vitals:   02/19/22 1120  BP: (!) 140/74  Pulse: 82  Resp: 18  Temp: 98.3 F (36.8 C)  SpO2: 100%     Body mass index is 25.44 kg/m.    ECOG FS:0 - Asymptomatic  General: Well-developed, well-nourished, no acute distress. Eyes: Pink conjunctiva, anicteric sclera. HEENT: Normocephalic, moist mucous membranes. Breast: Exam deferred today. Lungs: No audible wheezing or coughing. Heart: Regular rate and rhythm. Abdomen: Soft, nontender, no obvious distention. Musculoskeletal: No edema, cyanosis, or clubbing. Neuro: Alert, answering all questions appropriately. Cranial nerves grossly intact. Skin: No rashes or petechiae noted. Psych: Normal affect.  LAB RESULTS:  Lab Results  Component Value Date   NA 137 11/22/2021   K 4.2 11/22/2021   CL 102 11/22/2021   CO2 27 11/22/2021   GLUCOSE 93 11/22/2021   BUN 10 11/22/2021   CREATININE 0.79 11/22/2021   CALCIUM 10.0 11/22/2021   PROT 7.3 11/22/2021   ALBUMIN 4.3 10/17/2015   AST 13 11/22/2021   ALT 9 11/22/2021   ALKPHOS 38 (L) 10/17/2015   BILITOT 0.9 11/22/2021   GFRNONAA 78 11/04/2019   GFRAA 90 11/04/2019    Lab Results  Component Value Date   WBC 5.3 11/22/2021   NEUTROABS 3,633 11/04/2019   HGB 14.7 11/22/2021   HCT 42.4 11/22/2021   MCV 90.6 11/22/2021   PLT 267 11/22/2021     STUDIES: No results found.  ASSESSMENT: Pathologic stage Ia ER/PR positive, HER-2 negative invasive carcinoma of the lower outer quadrant of the right breast.  Oncotype DX score 13, low risk.  PLAN:    Pathologic stage Ia ER/PR positive, HER-2 negative invasive carcinoma of the lower outer quadrant of the right breast: Patient underwent lumpectomy on April 10, 2021 confirming stage of disease.  Oncotype score was low risk, therefore chemotherapy was not indicated.  Patient completed XRT on June 11, 2021.  Continue letrozole for total of 5 years completing treatment in April 2028.  Patient has a mammogram  scheduled on March 03, 2022.  Return to clinic in 6 months for routine evaluation.   Osteopenia: Patient's bone mineral density in October 2020 revealed a T score of -1.8.  Repeat bone mineral density on Jul 15, 2021 T score was reported at -2.4.  Continue to monitor closely.  Patient has been instructed to continue calcium and vitamin D supplementation.  Repeat in May 2024.   I spent a total of 20 minutes reviewing chart data, face-to-face evaluation with the patient, counseling and coordination of care as detailed above.    Patient expressed understanding and was in agreement with this plan. She also understands that She can call clinic at any time with any questions, concerns, or complaints.    Cancer Staging  Carcinoma of lower outer quadrant of right breast [facility]) Staging form: Breast, AJCC 8th Edition - Clinical stage from 04/01/2021: Stage IA (cT1b, cN0, cM0, G2, ER+, PR+, HER2-) - Signed by Lloyd Huger, MD on 04/03/2021 Stage  prefix: Initial diagnosis Histologic grading system: 3 grade system   Lloyd Huger, MD   02/19/2022 11:27 AM

## 2022-02-19 NOTE — Telephone Encounter (Signed)
Refill letrozole.

## 2022-03-02 ENCOUNTER — Other Ambulatory Visit: Payer: Self-pay | Admitting: Internal Medicine

## 2022-03-03 ENCOUNTER — Ambulatory Visit
Admission: RE | Admit: 2022-03-03 | Discharge: 2022-03-03 | Disposition: A | Discharge status: Home / Self Care | Payer: 59 | Source: Ambulatory Visit | Attending: General Surgery

## 2022-03-03 ENCOUNTER — Ambulatory Visit
Admission: RE | Admit: 2022-03-03 | Discharge: 2022-03-03 | Disposition: A | Discharge status: Home / Self Care | Payer: 59 | Source: Ambulatory Visit | Attending: General Surgery | Admitting: General Surgery

## 2022-03-03 DIAGNOSIS — Z170 Estrogen receptor positive status [ER+]: Secondary | ICD-10-CM

## 2022-03-03 DIAGNOSIS — R922 Inconclusive mammogram: Secondary | ICD-10-CM | POA: Diagnosis not present

## 2022-03-03 DIAGNOSIS — C50511 Malignant neoplasm of lower-outer quadrant of right female breast: Secondary | ICD-10-CM

## 2022-03-03 HISTORY — DX: Personal history of irradiation: Z92.3

## 2022-03-03 MED ORDER — ZOLPIDEM TARTRATE 5 MG PO TABS: 5.0000 mg | ORAL_TABLET | Freq: Every evening | ORAL | 0 refills | Status: DC | PRN

## 2022-04-02 ENCOUNTER — Other Ambulatory Visit: Payer: Self-pay | Admitting: Internal Medicine

## 2022-04-03 MED ORDER — ZOLPIDEM TARTRATE 5 MG PO TABS: 5.0000 mg | ORAL_TABLET | Freq: Every evening | ORAL | 0 refills | Status: DC | PRN

## 2022-04-07 ENCOUNTER — Other Ambulatory Visit: Payer: Self-pay

## 2022-04-07 DIAGNOSIS — H10813 Pingueculitis, bilateral: Secondary | ICD-10-CM | POA: Diagnosis not present

## 2022-04-07 DIAGNOSIS — M3501 Sicca syndrome with keratoconjunctivitis: Secondary | ICD-10-CM | POA: Diagnosis not present

## 2022-04-07 MED ORDER — PREDNISOLONE ACETATE 1 % OP SUSP
OPHTHALMIC | 0 refills | Status: DC
  Filled 2022-04-07: qty 5, 14d supply, fill #0

## 2022-05-01 ENCOUNTER — Other Ambulatory Visit: Payer: Self-pay | Admitting: Internal Medicine

## 2022-05-01 MED ORDER — ZOLPIDEM TARTRATE 5 MG PO TABS: 5.0000 mg | ORAL_TABLET | Freq: Every evening | ORAL | 0 refills | Status: DC | PRN

## 2022-05-14 ENCOUNTER — Other Ambulatory Visit: Payer: Self-pay

## 2022-05-14 DIAGNOSIS — H10813 Pingueculitis, bilateral: Secondary | ICD-10-CM | POA: Diagnosis not present

## 2022-05-14 DIAGNOSIS — M3501 Sicca syndrome with keratoconjunctivitis: Secondary | ICD-10-CM | POA: Diagnosis not present

## 2022-05-14 DIAGNOSIS — H2513 Age-related nuclear cataract, bilateral: Secondary | ICD-10-CM | POA: Diagnosis not present

## 2022-05-14 MED ORDER — PREDNISOLONE ACETATE 1 % OP SUSP
OPHTHALMIC | 0 refills | Status: DC
  Filled 2022-05-14: qty 5, 30d supply, fill #0

## 2022-05-17 ENCOUNTER — Telehealth: Payer: 59 | Admitting: Nurse Practitioner

## 2022-05-17 DIAGNOSIS — B9689 Other specified bacterial agents as the cause of diseases classified elsewhere: Secondary | ICD-10-CM | POA: Diagnosis not present

## 2022-05-17 DIAGNOSIS — J019 Acute sinusitis, unspecified: Secondary | ICD-10-CM

## 2022-05-17 MED ORDER — AMOXICILLIN-POT CLAVULANATE 875-125 MG PO TABS: 1.0000 | ORAL_TABLET | Freq: Two times a day (BID) | ORAL | 0 refills | Status: AC

## 2022-05-17 MED ORDER — FLUTICASONE PROPIONATE 50 MCG/ACT NA SUSP: 2.0000 | Freq: Every day | NASAL | 0 refills | Status: DC

## 2022-05-17 NOTE — Progress Notes (Signed)

## 2022-05-17 NOTE — Progress Notes (Signed)
I have spent 5 minutes in review of e-visit questionnaire, review and updating patient chart, medical decision making and response to patient.  ° °Laryn Venning W Rusti Arizmendi, NP ° °  °

## 2022-05-23 ENCOUNTER — Ambulatory Visit: Payer: No Typology Code available for payment source | Admitting: Internal Medicine

## 2022-06-02 ENCOUNTER — Other Ambulatory Visit: Payer: Self-pay | Admitting: Internal Medicine

## 2022-06-03 MED ORDER — ZOLPIDEM TARTRATE 5 MG PO TABS: 5.0000 mg | ORAL_TABLET | Freq: Every evening | ORAL | 0 refills | Status: DC | PRN

## 2022-06-05 ENCOUNTER — Encounter: Payer: Self-pay | Admitting: Internal Medicine

## 2022-06-05 ENCOUNTER — Ambulatory Visit (INDEPENDENT_AMBULATORY_CARE_PROVIDER_SITE_OTHER): Payer: 59 | Admitting: Internal Medicine

## 2022-06-05 VITALS — BP 122/84 | HR 89 | Temp 96.8°F | Wt 155.0 lb

## 2022-06-05 DIAGNOSIS — K219 Gastro-esophageal reflux disease without esophagitis: Secondary | ICD-10-CM

## 2022-06-05 DIAGNOSIS — E7800 Pure hypercholesterolemia, unspecified: Secondary | ICD-10-CM | POA: Diagnosis not present

## 2022-06-05 DIAGNOSIS — F5104 Psychophysiologic insomnia: Secondary | ICD-10-CM | POA: Diagnosis not present

## 2022-06-05 DIAGNOSIS — F418 Other specified anxiety disorders: Secondary | ICD-10-CM | POA: Diagnosis not present

## 2022-06-05 DIAGNOSIS — C50511 Malignant neoplasm of lower-outer quadrant of right female breast: Secondary | ICD-10-CM | POA: Diagnosis not present

## 2022-06-05 DIAGNOSIS — R3915 Urgency of urination: Secondary | ICD-10-CM

## 2022-06-05 DIAGNOSIS — R300 Dysuria: Secondary | ICD-10-CM

## 2022-06-05 DIAGNOSIS — R350 Frequency of micturition: Secondary | ICD-10-CM

## 2022-06-05 DIAGNOSIS — K5901 Slow transit constipation: Secondary | ICD-10-CM | POA: Diagnosis not present

## 2022-06-05 LAB — POCT URINALYSIS DIPSTICK
Bilirubin, UA: NEGATIVE
Glucose, UA: NEGATIVE
Ketones, UA: NEGATIVE
Nitrite, UA: NEGATIVE
Protein, UA: NEGATIVE
Spec Grav, UA: <=1.005 — AB (ref 1.010–1.025)
Urobilinogen, UA: 0.2 E.U./dL
pH, UA: 7.5 (ref 5.0–8.0)

## 2022-06-05 NOTE — Assessment & Plan Note (Addendum)
Continue omeprazole if needed Avoid foods that trigger reflux

## 2022-06-05 NOTE — Patient Instructions (Signed)

## 2022-06-05 NOTE — Assessment & Plan Note (Signed)
Continue Ambien as needed 

## 2022-06-05 NOTE — Assessment & Plan Note (Signed)
Continue yearly mammograms Continue letrozole

## 2022-06-05 NOTE — Assessment & Plan Note (Signed)
Encouraged high fiber diet and adequate water intake 

## 2022-06-05 NOTE — Assessment & Plan Note (Signed)
Not medicated Support offered 

## 2022-06-05 NOTE — Progress Notes (Signed)
Subjective:    Patient ID: Heather Jensen, female    DOB: [birth year], 61 y.o.   MRN: [id number]  HPI  Patient presents to clinic today for 46-month follow-up of chronic conditions.  Constipation: Managed with diet.  She is not currently taking any medications for this.  Colonoscopy from 12/2012 reviewed.  GERD: Triggered by eating too late at night.  She is not taking Omeprazole.  There is no upper GI on file.  Insomnia: She has difficulty falling and staying asleep.  She takes Ambien as prescribed.  There is no sleep study on file.  History of Breast Cancer: In remission status post.  She is taking Letrozole as prescribed.  She gets yearly mammograms.  She follows with oncology.  HLD: Her last LDL was 139, triglycerides 66, 10/2021.  She is not taking any cholesterol-lowering medication at this time.  She tries to consume low-fat diet.  Anxiety: Situational.  She is not currently taking any medications for this.  She is not currently seeing a therapist.  She denies depression, SI/HI.  Review of Systems     Past Medical History:  Diagnosis Date   Adenomatous polyp 2019   Anxiety    Breast cancer (HCC) 03/22/2021   Chicken pox    Depression    GERD (gastroesophageal reflux disease)    Personal history of radiation therapy     Current Outpatient Medications  Medication Sig Dispense Refill   Calcium-Magnesium-Vitamin D (CALCIUM 1200+D3 PO) Take by mouth.     fluticasone (FLONASE) 50 MCG/ACT nasal spray Place 2 sprays into both nostrils daily. 16 g 0   letrozole (FEMARA) 2.5 MG tablet Take 1 tablet (2.5 mg total) by mouth daily. 90 tablet 3   prednisoLONE acetate (PRED FORTE) 1 % ophthalmic suspension Place 1 drop into both eyes 2 (two) times daily for 7 days, THEN 1 drop into both eyes once daily 5 mL 0   zolpidem (AMBIEN) 5 MG tablet Take 1 tablet (5 mg total) by mouth at bedtime as needed for sleep. 30 tablet 0   No current facility-administered medications for this  visit.    No Known Allergies  Family History  Problem Relation Age of Onset   Hyperlipidemia Mother    Alzheimer's disease Mother    Hypertension Father    Heart disease Father    Melanoma Brother 58   Lung cancer Paternal Grandfather    Breast cancer Neg Hx    Stroke Neg Hx     Social History   Socioeconomic History   Marital status: Married    Spouse name: Not on file   Number of children: 1   Years of education: Not on file   Highest education level: Bachelor's degree (e.g., BA, AB, BS)  Occupational History   Not on file  Tobacco Use   Smoking status: Former    Packs/day: 0.50    Years: 30.00    Additional pack years: 0.00    Total pack years: 15.00    Types: Cigarettes    Quit date: 04/10/2014    Years since quitting: 8.1   Smokeless tobacco: Never  Vaping Use   Vaping Use: Never used  Substance and Sexual Activity   Alcohol use: Yes    Alcohol/week: 1.0 standard drink of alcohol    Types: 1 Glasses of wine per week    Comment: on special occassions   Drug use: No   Sexual activity: Yes    Partners: Male    Birth control/protection:  None  Other Topics Concern   Not on file  Social History Narrative   Not on file   Social Determinants of Health   Financial Resource Strain: Low Risk  (04/10/2017)   Overall Financial Resource Strain (CARDIA)    Difficulty of Paying Living Expenses: Not hard at all  Food Insecurity: No Food Insecurity (04/10/2017)   Hunger Vital Sign    Worried About Running Out of Food in the Last Year: Never true    Ran Out of Food in the Last Year: Never true  Transportation Needs: No Transportation Needs (04/10/2017)   PRAPARE - Administrator, Civil Service (Medical): No    Lack of Transportation (Non-Medical): No  Physical Activity: Sufficiently Active (04/10/2017)   Exercise Vital Sign    Days of Exercise per Week: 6 days    Minutes of Exercise per Session: 40 min  Stress: No Stress Concern Present (04/10/2017)    [employer] of Occupational Health - Occupational Stress Questionnaire    Feeling of Stress : Only a little  Social Connections: Unknown (04/10/2017)   Social Connection and Isolation Panel [NHANES]    Frequency of Communication with Friends and Family: Patient declined    Frequency of Social Gatherings with Friends and Family: Patient declined    Attends Religious Services: Patient declined    Database administrator or Organizations: Patient declined    Attends Banker Meetings: Patient declined    Marital Status: Patient declined  Intimate Partner Violence: Not At Risk (04/10/2017)   Humiliation, Afraid, Rape, and Kick questionnaire    Fear of Current or Ex-Partner: No    Emotionally Abused: No    Physically Abused: No    Sexually Abused: No     Constitutional: Denies fever, malaise, fatigue, headache or abrupt weight changes.  HEENT: Denies eye pain, eye redness, ear pain, ringing in the ears, wax buildup, runny nose, nasal congestion, bloody nose, or sore throat. Respiratory: Denies difficulty breathing, shortness of breath, cough or sputum production.   Cardiovascular: Denies chest pain, chest tightness, palpitations or swelling in the hands or feet.  Gastrointestinal: Patient reports constipation.  Denies abdominal pain, bloating, diarrhea or blood in the stool.  GU: Pt reports urinary urgency, frequency and dysuria. Denies urgency, burning sensation, blood in urine, odor or discharge. Musculoskeletal: Denies decrease in range of motion, difficulty with gait, muscle pain or joint pain and swelling.  Skin: Denies redness, rashes, lesions or ulcercations.  Neurological: Patient reports insomnia.  Denies dizziness, difficulty with memory, difficulty with speech or problems with balance and coordination.  Psych: Patient has a history of anxiety.  Denies depression, SI/HI.  No other specific complaints in a complete review of systems (except as listed in HPI  above).  Objective:   Physical Exam   BP 122/84 (BP Location: Left Arm, Patient Position: Sitting, Cuff Size: Normal)   Pulse 89   Temp (!) 96.8 F (36 C) (Temporal)   Wt 155 lb (70.3 kg)   SpO2 99%   BMI 24.64 kg/m   Wt Readings from Last 3 Encounters:  02/19/22 160 lb (72.6 kg)  12/19/21 152 lb (68.9 kg)  11/22/21 150 lb (68 kg)    General: Appears her stated age, well developed, well nourished in NAD. Skin: Warm, dry and intact.  HEENT: Head: normal shape and size; Eyes: sclera white, no icterus, conjunctiva pink, PERRLA and EOMs intact;  Cardiovascular: Normal rate and rhythm. S1,S2 noted.  No murmur, rubs or gallops noted.  No JVD or BLE edema. No carotid bruits noted. Pulmonary/Chest: Normal effort and positive vesicular breath sounds. No respiratory distress. No wheezes, rales or ronchi noted.  Musculoskeletal: No difficulty with gait.  Neurological: Alert and oriented. Coordination normal.  Psychiatric: Mood and affect normal. Behavior is normal. Judgment and thought content normal.    BMET    Component Value Date/Time   NA 137 11/22/2021 1533   K 4.2 11/22/2021 1533   CL 102 11/22/2021 1533   CO2 27 11/22/2021 1533   GLUCOSE 93 11/22/2021 1533   BUN 10 11/22/2021 1533   CREATININE 0.79 11/22/2021 1533   CALCIUM 10.0 11/22/2021 1533   GFRNONAA 78 11/04/2019 0908   GFRAA 90 11/04/2019 0908    Lipid Panel     Component Value Date/Time   CHOL 226 (H) 11/22/2021 1533   TRIG 66 11/22/2021 1533   HDL 71 11/22/2021 1533   CHOLHDL 3.2 11/22/2021 1533   VLDL 20.8 10/17/2015 1412   LDLCALC 139 (H) 11/22/2021 1533    CBC    Component Value Date/Time   WBC 5.3 11/22/2021 1533   RBC 4.68 11/22/2021 1533   HGB 14.7 11/22/2021 1533   HCT 42.4 11/22/2021 1533   PLT 267 11/22/2021 1533   MCV 90.6 11/22/2021 1533   MCH 31.4 11/22/2021 1533   MCHC 34.7 11/22/2021 1533   RDW 12.2 11/22/2021 1533   LYMPHSABS 1,786 11/04/2019 0908   EOSABS 130 11/04/2019 0908    BASOSABS 19 11/04/2019 0908    Hgb A1C Lab Results  Component Value Date   HGBA1C 4.9 09/13/2018           Assessment & Plan:   Urinary Urgency, Frequency and Dysuria:  Urinalysis showed small leuks and trace blood We will send urine culture Rx for Macrobid 100 mg twice daily x 5 days Push fluids  RTC in 6 months for annual exam Nicki Reaperegina Ludy Messamore, NP

## 2022-06-05 NOTE — Assessment & Plan Note (Signed)
C-Met and lipid profile today °Encouraged her to consume a low-fat diet °

## 2022-06-06 LAB — URINE CULTURE
MICRO NUMBER:: 14814462
SPECIMEN QUALITY:: ADEQUATE

## 2022-06-06 MED ORDER — NITROFURANTOIN MONOHYD MACRO 100 MG PO CAPS: 100.0000 mg | ORAL_CAPSULE | Freq: Two times a day (BID) | ORAL | 0 refills | Status: DC

## 2022-06-11 IMAGING — MG MM DIGITAL SCREENING BILAT W/ TOMO AND CAD
8 series · 8 of 24 positions shown · non-contrast
Comparison: Previous exam(s).

CLINICAL DATA: Screening.

EXAM:
DIGITAL SCREENING BILATERAL MAMMOGRAM WITH TOMOSYNTHESIS AND CAD
TECHNIQUE: Bilateral screening digital craniocaudal and mediolateral oblique
mammograms were obtained. Bilateral screening digital breast
tomosynthesis was performed. The images were evaluated with
computer-aided detection.

[R CC synth-2D]
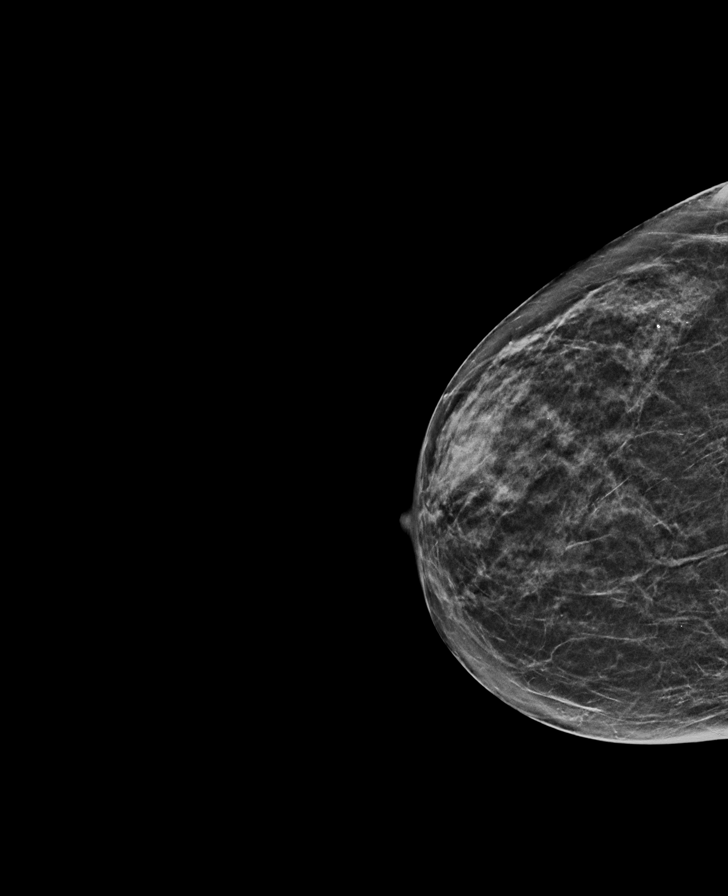

[R MLO synth-2D]
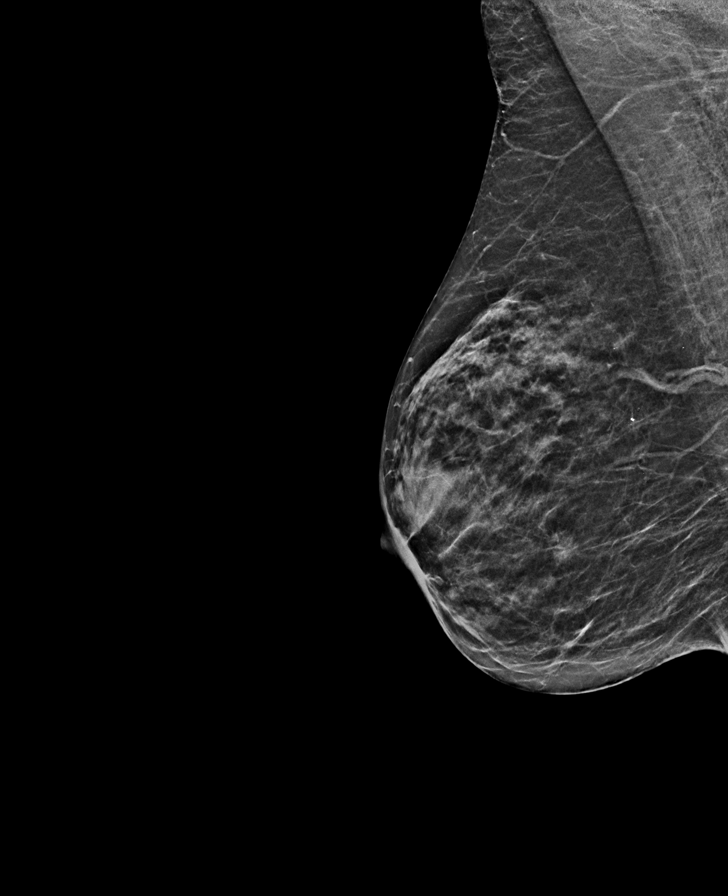

[L CC synth-2D]
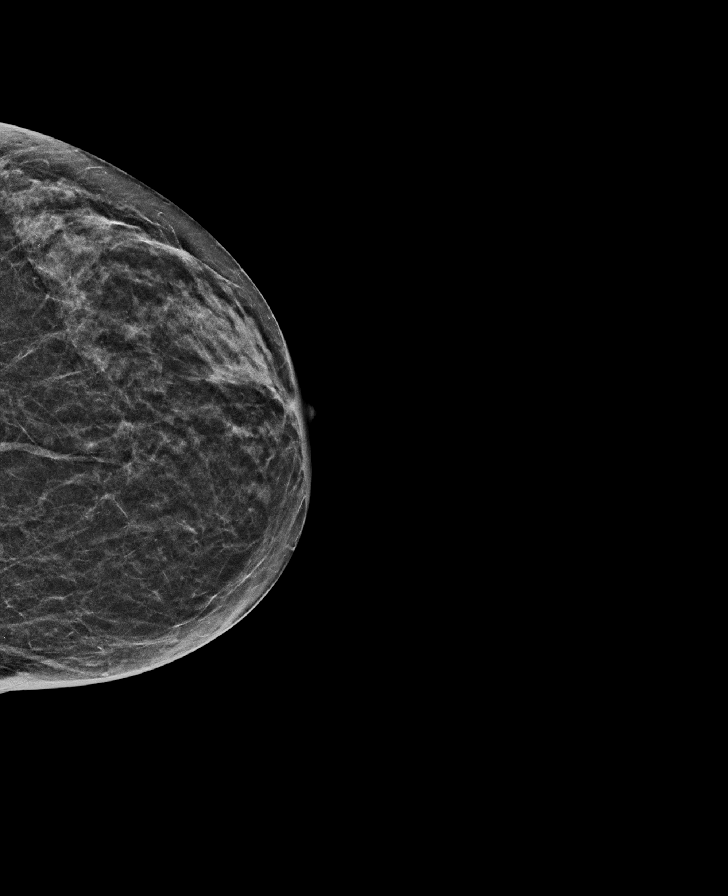

[L MLO synth-2D]
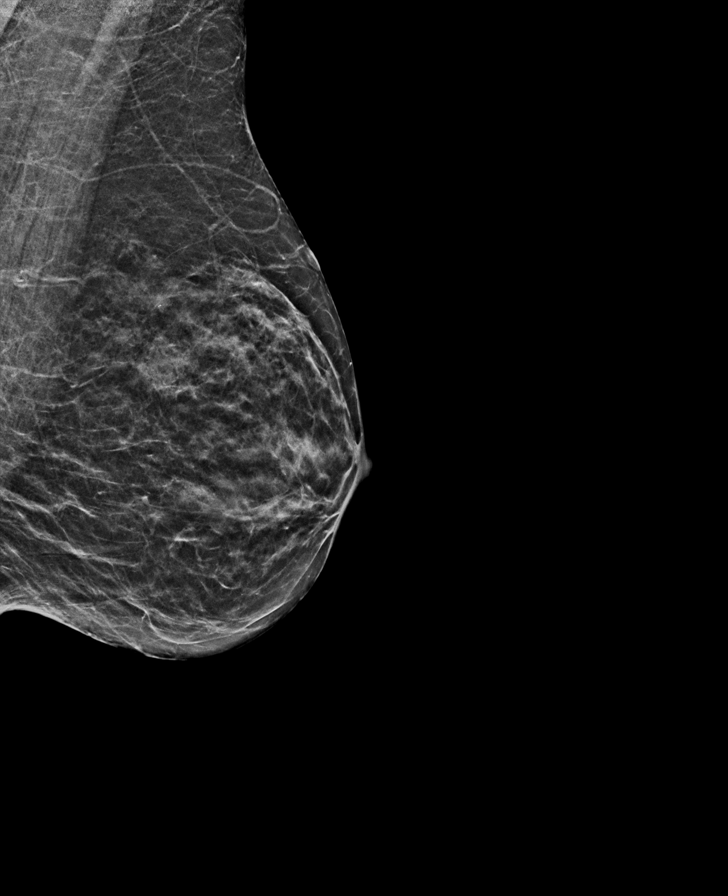

[R CC tomo · tomo slice 23/46.0]
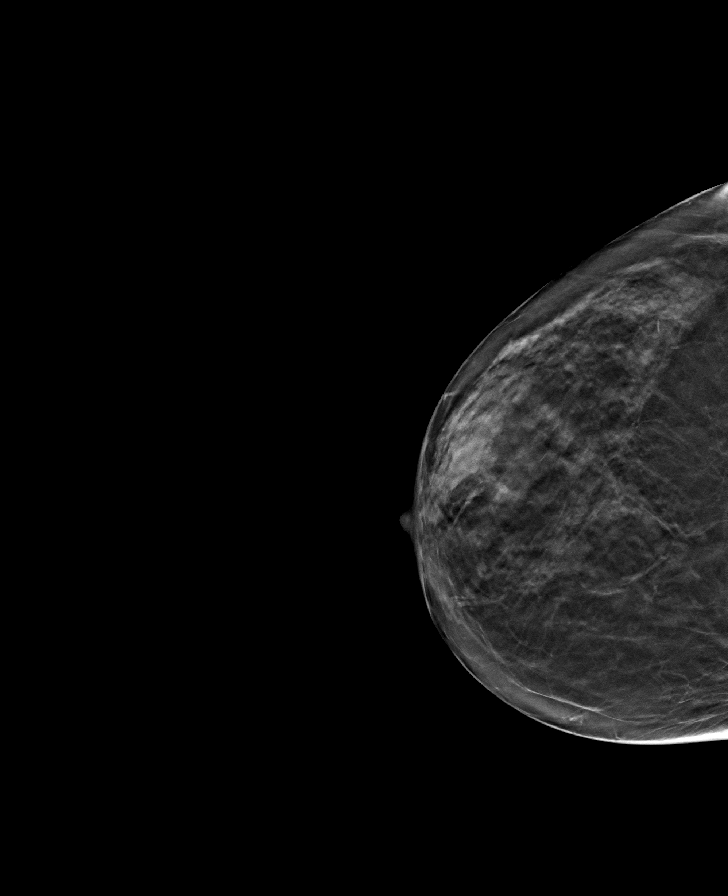

[L CC tomo · tomo slice 23/44.0]
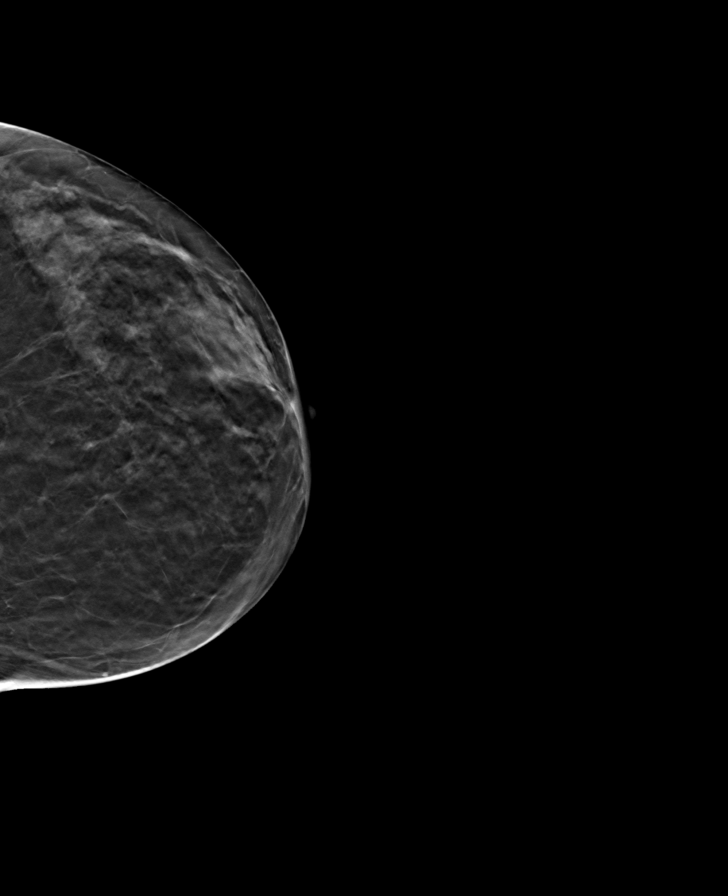

[R MLO tomo · tomo slice 23/44.0]
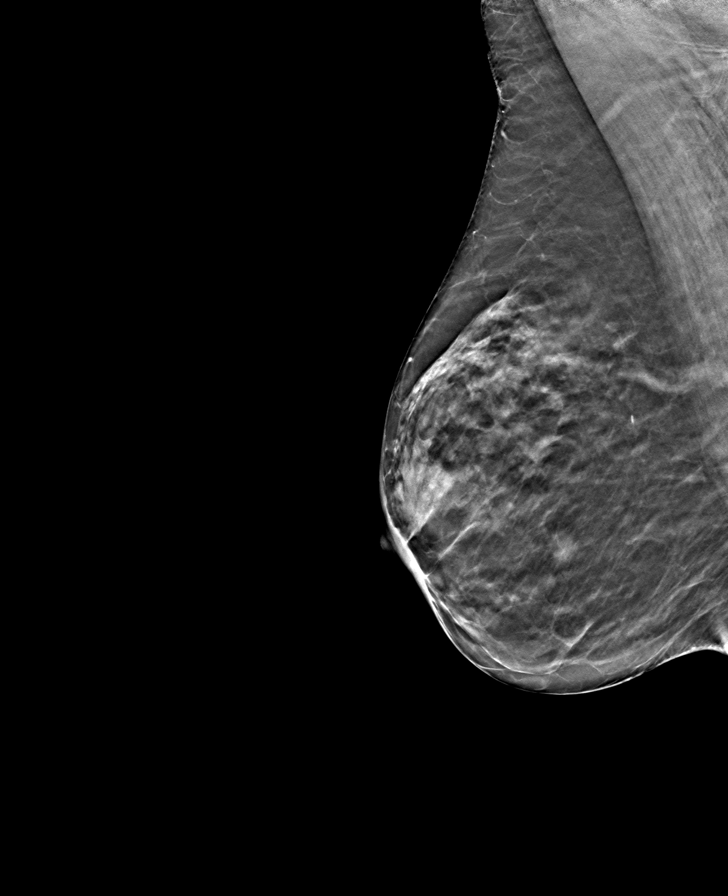

[L MLO tomo · tomo slice 23/46.0]
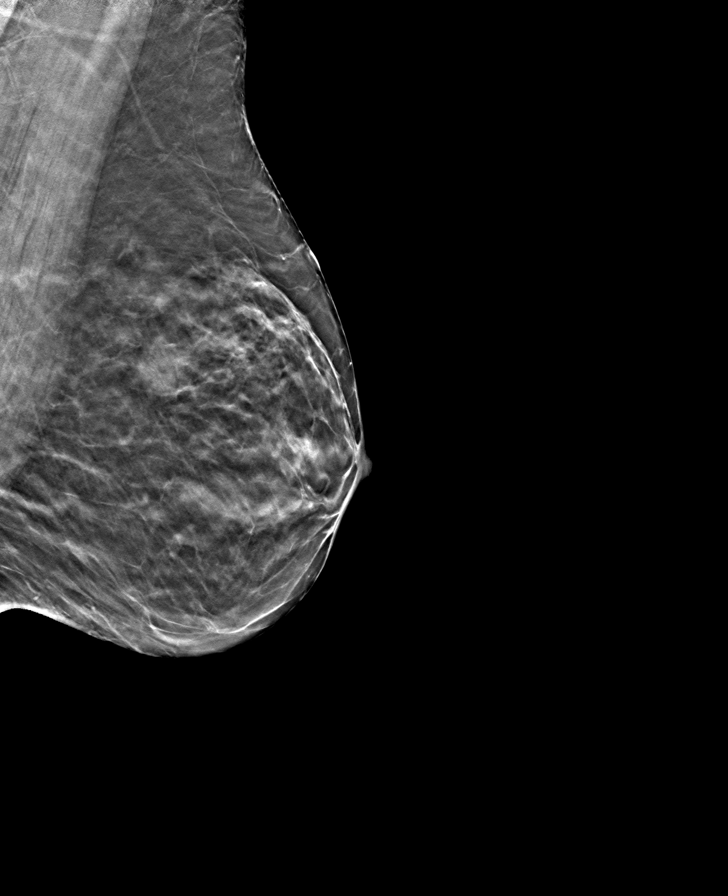

[8 of 24 positions shown; findings below may reference images not displayed]

ACR Breast Density Category c: The breast tissue is heterogeneously
dense, which may obscure small masses.
FINDINGS: In the right breast, a possible mass warrants further evaluation. In
the left breast, no findings suspicious for malignancy.
IMPRESSION: Further evaluation is suggested for a possible mass in the right
breast.

RECOMMENDATION:
Diagnostic mammogram and possibly ultrasound of the right breast.
(Code:[id number]-AAO)

The patient will be contacted regarding the findings, and additional
imaging will be scheduled.

BI-RADS CATEGORY  0: Incomplete. Need additional imaging evaluation
and/or prior mammograms for comparison.

## 2022-06-12 ENCOUNTER — Other Ambulatory Visit: Payer: 59

## 2022-06-12 DIAGNOSIS — E7800 Pure hypercholesterolemia, unspecified: Secondary | ICD-10-CM

## 2022-06-13 ENCOUNTER — Encounter: Payer: Self-pay | Admitting: Internal Medicine

## 2022-06-13 LAB — COMPLETE METABOLIC PANEL WITH GFR
AG Ratio: 1.7 (calc) (ref 1.0–2.5)
ALT: 9 U/L (ref 6–29)
AST: 15 U/L (ref 10–35)
Albumin: 4.4 g/dL (ref 3.6–5.1)
Alkaline phosphatase (APISO): 36 U/L — ABNORMAL LOW (ref 37–153)
BUN: 11 mg/dL (ref 7–25)
CO2: 28 mmol/L (ref 20–32)
Calcium: 9.7 mg/dL (ref 8.6–10.4)
Chloride: 103 mmol/L (ref 98–110)
Creat: 0.77 mg/dL (ref 0.50–1.05)
Globulin: 2.6 g/dL (calc) (ref 1.9–3.7)
Glucose, Bld: 98 mg/dL (ref 65–99)
Potassium: 4.2 mmol/L (ref 3.5–5.3)
Sodium: 139 mmol/L (ref 135–146)
Total Bilirubin: 0.6 mg/dL (ref 0.2–1.2)
Total Protein: 7 g/dL (ref 6.1–8.1)
eGFR: 88 mL/min/{1.73_m2} (ref >=60)

## 2022-06-13 LAB — LIPID PANEL
Cholesterol: 196 mg/dL (ref <200)
HDL: 75 mg/dL (ref >=50)
LDL Cholesterol (Calc): 107 mg/dL (calc) — ABNORMAL HIGH
Non-HDL Cholesterol (Calc): 121 mg/dL (calc) (ref <130)
Total CHOL/HDL Ratio: 2.6 (calc) (ref <5.0)
Triglycerides: 59 mg/dL (ref <150)

## 2022-07-01 ENCOUNTER — Other Ambulatory Visit: Payer: Self-pay

## 2022-07-02 ENCOUNTER — Ambulatory Visit
Admission: RE | Admit: 2022-07-02 | Discharge: 2022-07-02 | Disposition: A | Discharge status: Home / Self Care | Payer: 59 | Source: Ambulatory Visit | Attending: Radiation Oncology | Admitting: Radiation Oncology

## 2022-07-02 DIAGNOSIS — Z170 Estrogen receptor positive status [ER+]: Secondary | ICD-10-CM | POA: Diagnosis not present

## 2022-07-02 DIAGNOSIS — C50511 Malignant neoplasm of lower-outer quadrant of right female breast: Secondary | ICD-10-CM | POA: Diagnosis not present

## 2022-07-02 NOTE — Progress Notes (Signed)
Radiation Oncology Follow up Note  Name: Heather Jensen [facility]   Date:   07/02/2022 MRN:  [id number] DOB: 09-09-1960    This 62 y.o. female presents to the clinic today for 1 year follow-up status post whole breast radiation to her right breast for stage Ia ER positive invasive mammary carcinoma.  REFERRING PROVIDER: Lorre Munroe, NP  HPI: Patient is a 62 year old female now 1 year having completed whole breast radiation to her right breast for stage Ia ER positive invasive mammary carcinoma.  Seen today she is doing well.  She specifically denies breast tenderness cough or bone pain..  She is currently on Femara tolerating it well without side effect.  She had mammograms back in January which I have reviewed were BI-RADS 2 benign.  COMPLICATIONS OF TREATMENT: none  FOLLOW UP COMPLIANCE: keeps appointments   PHYSICAL EXAM:  There were no vitals taken for this visit. Lungs are clear to A&P cardiac examination essentially unremarkable with regular rate and rhythm. No dominant mass or nodularity is noted in either breast in 2 positions examined. Incision is well-healed. No axillary or supraclavicular adenopathy is appreciated. Cosmetic result is excellent.  Well-developed well-nourished patient in NAD. HEENT reveals PERLA, EOMI, discs not visualized.  Oral cavity is clear. No oral mucosal lesions are identified. Neck is clear without evidence of cervical or supraclavicular adenopathy. Lungs are clear to A&P. Cardiac examination is essentially unremarkable with regular rate and rhythm without murmur rub or thrill. Abdomen is benign with no organomegaly or masses noted. Motor sensory and DTR levels are equal and symmetric in the upper and lower extremities. Cranial nerves II through XII are grossly intact. Proprioception is intact. No peripheral adenopathy or edema is identified. No motor or sensory levels are noted. Crude visual fields are within normal range.  RADIOLOGY RESULTS: Mammograms  reviewed compatible with above-stated findings  PLAN: Present time she is doing well 1 year out with no evidence of disease.  On pleased with her overall progress have asked to see her back in 1 year for follow-up.  Patient knows to call with any concerns.  I would like to take this opportunity to thank you for allowing me to participate in the care of your patient.Carmina Miller, MD

## 2022-07-03 ENCOUNTER — Other Ambulatory Visit: Payer: Self-pay | Admitting: Internal Medicine

## 2022-07-04 MED ORDER — ZOLPIDEM TARTRATE 5 MG PO TABS: 5.0000 mg | ORAL_TABLET | Freq: Every evening | ORAL | 0 refills | Status: DC | PRN

## 2022-07-17 ENCOUNTER — Other Ambulatory Visit: Payer: 59

## 2022-08-02 ENCOUNTER — Other Ambulatory Visit: Payer: Self-pay | Admitting: Internal Medicine

## 2022-08-04 MED ORDER — ZOLPIDEM TARTRATE 5 MG PO TABS: 5.0000 mg | ORAL_TABLET | Freq: Every evening | ORAL | 0 refills | Status: DC | PRN

## 2022-08-21 ENCOUNTER — Ambulatory Visit: Payer: 59 | Admitting: Oncology

## 2022-09-02 ENCOUNTER — Other Ambulatory Visit: Payer: Self-pay | Admitting: Internal Medicine

## 2022-09-03 ENCOUNTER — Ambulatory Visit
Admission: RE | Admit: 2022-09-03 | Discharge: 2022-09-03 | Disposition: A | Discharge status: Home / Self Care | Payer: 59 | Source: Ambulatory Visit | Attending: Oncology | Admitting: Oncology

## 2022-09-03 DIAGNOSIS — C50511 Malignant neoplasm of lower-outer quadrant of right female breast: Secondary | ICD-10-CM | POA: Diagnosis not present

## 2022-09-03 DIAGNOSIS — Z79811 Long term (current) use of aromatase inhibitors: Secondary | ICD-10-CM | POA: Insufficient documentation

## 2022-09-03 DIAGNOSIS — M810 Age-related osteoporosis without current pathological fracture: Secondary | ICD-10-CM | POA: Diagnosis not present

## 2022-09-03 MED ORDER — ZOLPIDEM TARTRATE 5 MG PO TABS: 5.0000 mg | ORAL_TABLET | Freq: Every evening | ORAL | 0 refills | Status: DC | PRN

## 2022-09-09 ENCOUNTER — Inpatient Hospital Stay: Payer: 59 | Attending: Oncology | Admitting: Oncology

## 2022-09-09 ENCOUNTER — Encounter: Payer: Self-pay | Admitting: Oncology

## 2022-09-09 VITALS — BP 138/78 | HR 58 | Temp 97.9°F | Resp 16 | Ht 66.5 in | Wt 156.0 lb

## 2022-09-09 DIAGNOSIS — Z801 Family history of malignant neoplasm of trachea, bronchus and lung: Secondary | ICD-10-CM | POA: Insufficient documentation

## 2022-09-09 DIAGNOSIS — Z923 Personal history of irradiation: Secondary | ICD-10-CM | POA: Diagnosis not present

## 2022-09-09 DIAGNOSIS — Z87891 Personal history of nicotine dependence: Secondary | ICD-10-CM | POA: Insufficient documentation

## 2022-09-09 DIAGNOSIS — M810 Age-related osteoporosis without current pathological fracture: Secondary | ICD-10-CM | POA: Insufficient documentation

## 2022-09-09 DIAGNOSIS — Z79811 Long term (current) use of aromatase inhibitors: Secondary | ICD-10-CM | POA: Insufficient documentation

## 2022-09-09 DIAGNOSIS — C50511 Malignant neoplasm of lower-outer quadrant of right female breast: Secondary | ICD-10-CM | POA: Diagnosis not present

## 2022-09-09 DIAGNOSIS — Z170 Estrogen receptor positive status [ER+]: Secondary | ICD-10-CM | POA: Diagnosis not present

## 2022-09-09 NOTE — Progress Notes (Unsigned)
[facility]  [phone]) [phone] [phone]) [phone]  ID: Jani Files OB: 04/19/1960  MR#: [id number]  OZD#:[id number]  Patient Care Team: Lorre Munroe, NP as PCP - General (Internal Medicine) Jeralyn Ruths, MD as Consulting Physician (Oncology) Lemar Livings, Merrily Pew, MD as Consulting Physician (General Surgery) Carmina Miller, MD as Consulting Physician (Radiation Oncology) Jim Like, RN as Oncology Nurse Navigator  CHIEF COMPLAINT: Pathologic stage Ia ER/PR positive, HER-2 negative invasive carcinoma of the lower outer quadrant of the right breast.  Oncotype score 13, low risk.  INTERVAL HISTORY: Patient returns to clinic today for routine 93-month evaluation.  She continues to feel well and remains asymptomatic.  She is tolerating letrozole without significant side effects.  She has no neurologic complaints.  She denies any recent fevers or illnesses.  She has a good appetite and denies weight loss.  She has no chest pain, shortness of breath, cough, or hemoptysis.  She denies any nausea, vomiting, constipation, or diarrhea.  She has no urinary complaints.  Patient feels at her baseline and offers no specific complaints today.  REVIEW OF SYSTEMS:   Review of Systems  Constitutional: Negative.  Negative for fever, malaise/fatigue and weight loss.  Respiratory: Negative.  Negative for cough, hemoptysis and shortness of breath.   Cardiovascular: Negative.  Negative for chest pain and leg swelling.  Gastrointestinal: Negative.  Negative for abdominal pain.  Genitourinary: Negative.  Negative for dysuria.  Musculoskeletal: Negative.  Negative for back pain.  Skin: Negative.  Negative for rash.  Neurological: Negative.  Negative for dizziness, focal weakness, weakness and headaches.  Psychiatric/Behavioral: Negative.  The patient is not nervous/anxious.     As per HPI. Otherwise, a complete review of systems is negative.  PAST MEDICAL  HISTORY: Past Medical History:  Diagnosis Date  . Adenomatous polyp 2019  . Anxiety   . Breast cancer (HCC) 03/22/2021  . Chicken pox   . Depression   . GERD (gastroesophageal reflux disease)   . Personal history of radiation therapy     PAST SURGICAL HISTORY: Past Surgical History:  Procedure Laterality Date  . BREAST BIOPSY Left 2010   biopsy of a lump  . BREAST LUMPECTOMY    . BREAST LUMPECTOMY WITH SENTINEL LYMPH NODE BIOPSY Right 04/10/2021   Procedure: BREAST LUMPECTOMY WITH SENTINEL LYMPH NODE BX;  Surgeon: Earline Mayotte, MD;  Location: [facility] ORS;  Service: General;  Laterality: Right;  . CESAREAN SECTION  1989  . COLONOSCOPY  2019   Adenomatous Polyp: CBF 12/2017: Recall ltr mailed 12/11/17  . TUBAL LIGATION  2000    FAMILY HISTORY: Family History  Problem Relation Age of Onset  . Hyperlipidemia Mother   . Alzheimer's disease Mother   . Hypertension Father   . Heart disease Father   . Melanoma Brother 17  . Lung cancer Paternal Grandfather   . Breast cancer Neg Hx   . Stroke Neg Hx     ADVANCED DIRECTIVES (Y/N):  N  HEALTH MAINTENANCE: Social History   Tobacco Use  . Smoking status: Former    Current packs/day: 0.00    Average packs/day: 0.5 packs/day for 30.0 years (15.0 ttl pk-yrs)    Types: Cigarettes    Start date: 04/10/1984    Quit date: 04/10/2014    Years since quitting: 8.4  . Smokeless tobacco: Never  Vaping Use  . Vaping status: Never Used  Substance Use Topics  . Alcohol use: Yes    Alcohol/week: 1.0 standard drink of alcohol  Types: 1 Glasses of wine per week    Comment: on special occassions  . Drug use: No     Colonoscopy:  PAP:  Bone density:  Lipid panel:  No Known Allergies  Current Outpatient Medications  Medication Sig Dispense Refill  . Calcium-Magnesium-Vitamin D (CALCIUM 1200+D3 PO) Take by mouth.    . letrozole (FEMARA) 2.5 MG tablet Take 1 tablet (2.5 mg total) by mouth daily. 90 tablet 3  . zolpidem  (AMBIEN) 5 MG tablet Take 1 tablet (5 mg total) by mouth at bedtime as needed for sleep. 30 tablet 0   No current facility-administered medications for this visit.    OBJECTIVE: Vitals:   09/09/22 1448  BP: 138/78  Pulse: (!) 58  Resp: 16  Temp: 97.9 F (36.6 C)  SpO2: 100%     Body mass index is 24.8 kg/m.    ECOG FS:0 - Asymptomatic  General: Well-developed, well-nourished, no acute distress. Eyes: Pink conjunctiva, anicteric sclera. HEENT: Normocephalic, moist mucous membranes. Breast: Exam deferred today. Lungs: No audible wheezing or coughing. Heart: Regular rate and rhythm. Abdomen: Soft, nontender, no obvious distention. Musculoskeletal: No edema, cyanosis, or clubbing. Neuro: Alert, answering all questions appropriately. Cranial nerves grossly intact. Skin: No rashes or petechiae noted. Psych: Normal affect.  LAB RESULTS:  Lab Results  Component Value Date   NA 139 06/12/2022   K 4.2 06/12/2022   CL 103 06/12/2022   CO2 28 06/12/2022   GLUCOSE 98 06/12/2022   BUN 11 06/12/2022   CREATININE 0.77 06/12/2022   CALCIUM 9.7 06/12/2022   PROT 7.0 06/12/2022   ALBUMIN 4.3 10/17/2015   AST 15 06/12/2022   ALT 9 06/12/2022   ALKPHOS 38 (L) 10/17/2015   BILITOT 0.6 06/12/2022   GFRNONAA 78 11/04/2019   GFRAA 90 11/04/2019    Lab Results  Component Value Date   WBC 5.3 11/22/2021   NEUTROABS 3,633 11/04/2019   HGB 14.7 11/22/2021   HCT 42.4 11/22/2021   MCV 90.6 11/22/2021   PLT 267 11/22/2021     STUDIES: DG Bone Density  Result Date: 09/03/2022 EXAM: DUAL X-RAY ABSORPTIOMETRY (DXA) FOR BONE MINERAL DENSITY IMPRESSION: Your patient Toniesha Zellner completed a BMD test on 09/03/2022 using the [employer] DXA System (software version: 14.10) manufactured by [employer]. The following summarizes the results of our evaluation. Technologist: [facility] PATIENT BIOGRAPHICAL: Name: Kadejah, Sandiford Patient ID: [id number] Birth Date: 1960/10/30 Height:  66.0 in. Gender: Female Exam Date: 09/03/2022 Weight: 155.8 lbs. Indications: Caucasian, family hx hip fracture, History of Breast Cancer, History of Radiation, Postmenopausal Fractures: Treatments: Calcium, Letrozole, Vitamin D DENSITOMETRY RESULTS: Site      Region      Measured Date Measured Age WHO Classification Young Adult T-score BMD         %Change vs. Previous Significant Change (*) AP Spine L1-L4 09/03/2023 61.3 Osteopenia -2.0 0.941 g/cm2 DualFemur Total Right 09[age]03/2022 61.3 Osteoporosis -2.5 0.693 g/cm2 ASSESSMENT: The BMD measured at Femur Total Right is 0.693 g/cm2 with a T-score of -2.5. This patient is considered osteoporotic according to World Health Organization [facility]) criteria. The scan quality is good. World Science writer [facility]) criteria for post-menopausal, Caucasian Women: Normal:                   T-score at or above -1 SD Osteopenia/low bone mass: T-score between -1 and -2.5 SD Osteoporosis:             T-score at or below -2.5 SD  RECOMMENDATIONS: 1. All patients should optimize calcium and vitamin D intake. 2. Consider FDA-approved medical therapies in postmenopausal women and men aged 27 years and older, based on the following: a. A hip or vertebral(clinical or morphometric) fracture b. T-score < -2.5 at the femoral neck or spine after appropriate evaluation to exclude secondary causes c. Low bone mass (T-score between -1.0 and -2.5 at the femoral neck or spine) and a 10-year probability of a hip fracture > 3% or a 10-year probability of a major osteoporosis-related fracture > 20% based on the [place]-adapted WHO algorithm 3. Clinician judgment and/or patient preferences may indicate treatment for people with 10-year fracture probabilities above or below these levels FOLLOW-UP: People with diagnosed cases of osteoporosis or at high risk for fracture should have regular bone mineral density tests. For patients eligible for Medicare, routine testing is allowed once every 2 years. The testing  frequency can be increased to one year for patients who have rapidly progressing disease, those who are receiving or discontinuing medical therapy to restore bone mass, or have additional risk factors. I have reviewed this report, and agree with the above findings. [facility] Electronically Signed   By: Frederico Hamman M.D.   On: 09/03/2022 14:39    ASSESSMENT: Pathologic stage Ia ER/PR positive, HER-2 negative invasive carcinoma of the lower outer quadrant of the right breast.  Oncotype DX score 13, low risk.  PLAN:    Pathologic stage Ia ER/PR positive, HER-2 negative invasive carcinoma of the lower outer quadrant of the right breast: Patient underwent lumpectomy on April 10, 2021 confirming stage of disease.  Oncotype score was low risk, therefore chemotherapy was not indicated.  Patient completed XRT on June 11, 2021.  Continue letrozole for total of 5 years completing treatment in April 2028.  Patient has a mammogram scheduled on March 03, 2022.  Return to clinic in 6 months for routine evaluation.   Osteopenia: Patient's bone mineral density in October 2020 revealed a T score of -1.8.  Repeat bone mineral density on Jul 15, 2021 T score was reported at -2.4.  Continue to monitor closely.  Patient has been instructed to continue calcium and vitamin D supplementation.  Repeat in May 2024.   I spent a total of 20 minutes reviewing chart data, face-to-face evaluation with the patient, counseling and coordination of care as detailed above.    Patient expressed understanding and was in agreement with this plan. She also understands that She can call clinic at any time with any questions, concerns, or complaints.    Cancer Staging  Carcinoma of lower outer quadrant of right breast [facility] Staging form: Breast, AJCC 8th Edition - Clinical stage from 04/01/2021: Stage IA (cT1b, cN0, cM0, G2, ER+, PR+, HER2-) - Signed by Jeralyn Ruths, MD on 04/03/2021 Stage prefix: Initial  diagnosis Histologic grading system: 3 grade system   Jeralyn Ruths, MD   09/09/2022 3:00 PM

## 2022-10-02 ENCOUNTER — Other Ambulatory Visit: Payer: Self-pay | Admitting: Internal Medicine

## 2022-10-03 MED ORDER — ZOLPIDEM TARTRATE 5 MG PO TABS: 5.0000 mg | ORAL_TABLET | Freq: Every evening | ORAL | 0 refills | Status: DC | PRN

## 2022-11-03 ENCOUNTER — Other Ambulatory Visit: Payer: Self-pay | Admitting: Internal Medicine

## 2022-11-04 MED ORDER — ZOLPIDEM TARTRATE 5 MG PO TABS: 5.0000 mg | ORAL_TABLET | Freq: Every evening | ORAL | 0 refills | Status: DC | PRN

## 2022-12-03 ENCOUNTER — Other Ambulatory Visit: Payer: Self-pay | Admitting: Internal Medicine

## 2022-12-04 MED ORDER — ZOLPIDEM TARTRATE 5 MG PO TABS: 5.0000 mg | ORAL_TABLET | Freq: Every evening | ORAL | 0 refills | Status: DC | PRN

## 2022-12-12 ENCOUNTER — Ambulatory Visit (INDEPENDENT_AMBULATORY_CARE_PROVIDER_SITE_OTHER): Payer: 59 | Admitting: Internal Medicine

## 2022-12-12 ENCOUNTER — Other Ambulatory Visit (HOSPITAL_COMMUNITY)
Admission: RE | Admit: 2022-12-12 | Discharge: 2022-12-12 | Disposition: A | Discharge status: Home / Self Care | Payer: 59 | Source: Ambulatory Visit | Attending: Internal Medicine | Admitting: Internal Medicine

## 2022-12-12 ENCOUNTER — Encounter: Payer: Self-pay | Admitting: Internal Medicine

## 2022-12-12 VITALS — BP 120/82 | HR 77 | Ht 66.5 in | Wt 156.0 lb

## 2022-12-12 DIAGNOSIS — Z0001 Encounter for general adult medical examination with abnormal findings: Secondary | ICD-10-CM

## 2022-12-12 DIAGNOSIS — Z1211 Encounter for screening for malignant neoplasm of colon: Secondary | ICD-10-CM | POA: Diagnosis not present

## 2022-12-12 DIAGNOSIS — Z124 Encounter for screening for malignant neoplasm of cervix: Secondary | ICD-10-CM | POA: Insufficient documentation

## 2022-12-12 DIAGNOSIS — E7800 Pure hypercholesterolemia, unspecified: Secondary | ICD-10-CM | POA: Diagnosis not present

## 2022-12-12 DIAGNOSIS — R739 Hyperglycemia, unspecified: Secondary | ICD-10-CM

## 2022-12-12 NOTE — Progress Notes (Signed)
Subjective:    Patient ID: Heather Jensen, female    DOB: 09/27/1960, 62 y.o.   MRN: [id number]  HPI  Patient presents to clinic today for her annual exam.  Flu: 11/2021 Tetanus: COVID: X 1 Shingrix: 04/2018, 11/2018 Pap smear: 06/2017 Mammogram: 02/2022 Colon screening: 12/2012 Bone density: 08/2022 Vision screening: annually Dentist: biannually  Diet: She does eat meat. She consumes fruits and veggies. She tries to avoid fried foods. She drinks mostly coffee, water. Exercise: Walking   Review of Systems     Past Medical History:  Diagnosis Date   Adenomatous polyp 2019   Anxiety    Breast cancer (HCC) 03/22/2021   Chicken pox    Depression    GERD (gastroesophageal reflux disease)    Personal history of radiation therapy     Current Outpatient Medications  Medication Sig Dispense Refill   Calcium-Magnesium-Vitamin D (CALCIUM 1200+D3 PO) Take by mouth.     letrozole (FEMARA) 2.5 MG tablet Take 1 tablet (2.5 mg total) by mouth daily. 90 tablet 3   zolpidem (AMBIEN) 5 MG tablet Take 1 tablet (5 mg total) by mouth at bedtime as needed for sleep. 30 tablet 0   No current facility-administered medications for this visit.    No Known Allergies  Family History  Problem Relation Age of Onset   Hyperlipidemia Mother    Alzheimer's disease Mother    Hypertension Father    Heart disease Father    Melanoma Brother 39   Lung cancer Paternal Grandfather    Breast cancer Neg Hx    Stroke Neg Hx     Social History   Socioeconomic History   Marital status: Married    Spouse name: Not on file   Number of children: 1   Years of education: Not on file   Highest education level: Bachelor's degree (e.g., BA, AB, BS)  Occupational History   Not on file  Tobacco Use   Smoking status: Former    Current packs/day: 0.00    Average packs/day: 0.5 packs/day for 30.0 years (15.0 ttl pk-yrs)    Types: Cigarettes    Start date: 04/10/1984    Quit date: 04/10/2014     Years since quitting: 8.6   Smokeless tobacco: Never  Vaping Use   Vaping status: Never Used  Substance and Sexual Activity   Alcohol use: Yes    Alcohol/week: 1.0 standard drink of alcohol    Types: 1 Glasses of wine per week    Comment: on special occassions   Drug use: No   Sexual activity: Yes    Partners: Male    Birth control/protection: None  Other Topics Concern   Not on file  Social History Narrative   Not on file   Social Determinants of Health   Financial Resource Strain: Low Risk  (04/10/2017)   Overall Financial Resource Strain (CARDIA)    Difficulty of Paying Living Expenses: Not hard at all  Food Insecurity: No Food Insecurity (04/10/2017)   Hunger Vital Sign    Worried About Running Out of Food in the Last Year: Never true    Ran Out of Food in the Last Year: Never true  Transportation Needs: No Transportation Needs (04/10/2017)   PRAPARE - Administrator, Civil Service (Medical): No    Lack of Transportation (Non-Medical): No  Physical Activity: Sufficiently Active (04/10/2017)   Exercise Vital Sign    Days of Exercise per Week: 6 days    Minutes of Exercise  per Session: 40 min  Stress: No Stress Concern Present (04/10/2017)   [employer] of Occupational Health - Occupational Stress Questionnaire    Feeling of Stress : Only a little  Social Connections: Unknown (04/10/2017)   Social Connection and Isolation Panel [NHANES]    Frequency of Communication with Friends and Family: Patient declined    Frequency of Social Gatherings with Friends and Family: Patient declined    Attends Religious Services: Patient declined    Database administrator or Organizations: Patient declined    Attends Banker Meetings: Patient declined    Marital Status: Patient declined  Intimate Partner Violence: Not At Risk (04/10/2017)   Humiliation, Afraid, Rape, and Kick questionnaire    Fear of Current or Ex-Partner: No    Emotionally Abused: No     Physically Abused: No    Sexually Abused: No     Constitutional: Denies fever, malaise, fatigue, headache or abrupt weight changes.  HEENT: Denies eye pain, eye redness, ear pain, ringing in the ears, wax buildup, runny nose, nasal congestion, bloody nose, or sore throat. Respiratory: Denies difficulty breathing, shortness of breath, cough or sputum production.   Cardiovascular: Denies chest pain, chest tightness, palpitations or swelling in the hands or feet.  Gastrointestinal: Patient reports constipation.  Denies abdominal pain, bloating, diarrhea or blood in the stool.  GU: Patient reports vaginal dryness.  Denies urgency, frequency, pain with urination, burning sensation, blood in urine, odor or discharge. Musculoskeletal: Denies decrease in range of motion, difficulty with gait, muscle pain or joint pain and swelling.  Skin: Denies redness, rashes, lesions or ulcercations.  Neurological: Patient reports insomnia.  Denies dizziness, difficulty with memory, difficulty with speech or problems with balance and coordination.  Psych: Patient has a history of anxiety.  Denies depression, SI/HI.  No other specific complaints in a complete review of systems (except as listed in HPI above).  Objective:   Physical Exam  BP 120/82   Pulse 77   Ht 5' 6.5" (1.689 m)   Wt 156 lb (70.8 kg)   SpO2 98%   BMI 24.80 kg/m   Wt Readings from Last 3 Encounters:  09/09/22 156 lb (70.8 kg)  07/02/22 152 lb 8 oz (69.2 kg)  06/05/22 155 lb (70.3 kg)    General: Appears her stated age, well developed, well nourished in NAD. Skin: Warm, dry and intact.  HEENT: Head: normal shape and size; Eyes: sclera white, no icterus, conjunctiva pink, PERRLA and EOMs intact;  Neck:  Neck supple, trachea midline. No masses, lumps or thyromegaly present.  Cardiovascular: Normal rate and rhythm. S1,S2 noted.  No murmur, rubs or gallops noted. No JVD or BLE edema. No carotid bruits noted. Pulmonary/Chest: Normal  effort and positive vesicular breath sounds. No respiratory distress. No wheezes, rales or ronchi noted.  Abdomen: Soft and nontender. Normal bowel sounds. No distention or masses noted. Liver, spleen and kidneys non palpable. Pelvic: Normal female anatomy.  Cervix without mass or lesion.  No CMT.  Adnexa nonpalpable. Musculoskeletal: Strength 5/5 BUE/BLE.  No difficulty with gait.  Neurological: Alert and oriented. Cranial nerves II-XII grossly intact. Coordination normal.  Psychiatric: Mood and affect normal. Behavior is normal. Judgment and thought content normal.     BMET    Component Value Date/Time   NA 139 06/12/2022 0823   K 4.2 06/12/2022 0823   CL 103 06/12/2022 0823   CO2 28 06/12/2022 0823   GLUCOSE 98 06/12/2022 0823   BUN 11 06/12/2022 0823  CREATININE 0.77 06/12/2022 0823   CALCIUM 9.7 06/12/2022 0823   GFRNONAA 78 11/04/2019 0908   GFRAA 90 11/04/2019 0908    Lipid Panel     Component Value Date/Time   CHOL 196 06/12/2022 0823   TRIG 59 06/12/2022 0823   HDL 75 06/12/2022 0823   CHOLHDL 2.6 06/12/2022 0823   VLDL 20.8 10/17/2015 1412   LDLCALC 107 (H) 06/12/2022 0823    CBC    Component Value Date/Time   WBC 5.3 11/22/2021 1533   RBC 4.68 11/22/2021 1533   HGB 14.7 11/22/2021 1533   HCT 42.4 11/22/2021 1533   PLT 267 11/22/2021 1533   MCV 90.6 11/22/2021 1533   MCH 31.4 11/22/2021 1533   MCHC 34.7 11/22/2021 1533   RDW 12.2 11/22/2021 1533   LYMPHSABS 1,786 11/04/2019 0908   EOSABS 130 11/04/2019 0908   BASOSABS 19 11/04/2019 0908    Hgb A1C Lab Results  Component Value Date   HGBA1C 4.9 09/13/2018           Assessment & Plan:   Preventative health maintenance:  Flu shot will be done at work Tetanus declined today Encouraged her to get her COVID booster Shingrix UTD Pap smear today, she declines STD screening Mammogram UTD Bone density UTD Referral to GI for screening colonoscopy Encouraged her to consume a balanced diet and  exercise regimen Advised her to see an eye doctor and dentist annually We will check CBC, c-Met, lipid, A1c today  RTC in 6 months, follow-up chronic conditions Nicki Reaper, NP

## 2022-12-12 NOTE — Patient Instructions (Signed)
Health Maintenance for Postmenopausal Women Menopause is a normal process in which your ability to get pregnant comes to an end. This process happens slowly over many months or years, usually between the ages of 48 and 55. Menopause is complete when you have missed your menstrual period for 12 months. It is important to talk with your health care provider about some of the most common conditions that affect women after menopause (postmenopausal women). These include heart disease, cancer, and bone loss (osteoporosis). Adopting a healthy lifestyle and getting preventive care can help to promote your health and wellness. The actions you take can also lower your chances of developing some of these common conditions. What are the signs and symptoms of menopause? During menopause, you may have the following symptoms: Hot flashes. These can be moderate or severe. Night sweats. Decrease in sex drive. Mood swings. Headaches. Tiredness (fatigue). Irritability. Memory problems. Problems falling asleep or staying asleep. Talk with your health care provider about treatment options for your symptoms. Do I need hormone replacement therapy? Hormone replacement therapy is effective in treating symptoms that are caused by menopause, such as hot flashes and night sweats. Hormone replacement carries certain risks, especially as you become older. If you are thinking about using estrogen or estrogen with progestin, discuss the benefits and risks with your health care provider. How can I reduce my risk for heart disease and stroke? The risk of heart disease, heart attack, and stroke increases as you age. One of the causes may be a change in the body's hormones during menopause. This can affect how your body uses dietary fats, triglycerides, and cholesterol. Heart attack and stroke are medical emergencies. There are many things that you can do to help prevent heart disease and stroke. Watch your blood pressure High  blood pressure causes heart disease and increases the risk of stroke. This is more likely to develop in people who have high blood pressure readings or are overweight. Have your blood pressure checked: Every 3-5 years if you are 18-39 years of age. Every year if you are 40 years old or older. Eat a healthy diet  Eat a diet that includes plenty of vegetables, fruits, low-fat dairy products, and lean protein. Do not eat a lot of foods that are high in solid fats, added sugars, or sodium. Get regular exercise Get regular exercise. This is one of the most important things you can do for your health. Most adults should: Try to exercise for at least 150 minutes each week. The exercise should increase your heart rate and make you sweat (moderate-intensity exercise). Try to do strengthening exercises at least twice each week. Do these in addition to the moderate-intensity exercise. Spend less time sitting. Even light physical activity can be beneficial. Other tips Work with your health care provider to achieve or maintain a healthy weight. Do not use any products that contain nicotine or tobacco. These products include cigarettes, chewing tobacco, and vaping devices, such as e-cigarettes. If you need help quitting, ask your health care provider. Know your numbers. Ask your health care provider to check your cholesterol and your blood sugar (glucose). Continue to have your blood tested as directed by your health care provider. Do I need screening for cancer? Depending on your health history and family history, you may need to have cancer screenings at different stages of your life. This may include screening for: Breast cancer. Cervical cancer. Lung cancer. Colorectal cancer. What is my risk for osteoporosis? After menopause, you may be   at increased risk for osteoporosis. Osteoporosis is a condition in which bone destruction happens more quickly than new bone creation. To help prevent osteoporosis or  the bone fractures that can happen because of osteoporosis, you may take the following actions: If you are 19-50 years old, get at least 1,000 mg of calcium and at least 600 international units (IU) of vitamin D per day. If you are older than age 50 but younger than age 70, get at least 1,200 mg of calcium and at least 600 international units (IU) of vitamin D per day. If you are older than age 70, get at least 1,200 mg of calcium and at least 800 international units (IU) of vitamin D per day. Smoking and drinking excessive alcohol increase the risk of osteoporosis. Eat foods that are rich in calcium and vitamin D, and do weight-bearing exercises several times each week as directed by your health care provider. How does menopause affect my mental health? Depression may occur at any age, but it is more common as you become older. Common symptoms of depression include: Feeling depressed. Changes in sleep patterns. Changes in appetite or eating patterns. Feeling an overall lack of motivation or enjoyment of activities that you previously enjoyed. Frequent crying spells. Talk with your health care provider if you think that you are experiencing any of these symptoms. General instructions See your health care provider for regular wellness exams and vaccines. This may include: Scheduling regular health, dental, and eye exams. Getting and maintaining your vaccines. These include: Influenza vaccine. Get this vaccine each year before the flu season begins. Pneumonia vaccine. Shingles vaccine. Tetanus, diphtheria, and pertussis (Tdap) booster vaccine. Your health care provider may also recommend other immunizations. Tell your health care provider if you have ever been abused or do not feel safe at home. Summary Menopause is a normal process in which your ability to get pregnant comes to an end. This condition causes hot flashes, night sweats, decreased interest in sex, mood swings, headaches, or lack  of sleep. Treatment for this condition may include hormone replacement therapy. Take actions to keep yourself healthy, including exercising regularly, eating a healthy diet, watching your weight, and checking your blood pressure and blood sugar levels. Get screened for cancer and depression. Make sure that you are up to date with all your vaccines. This information is not intended to replace advice given to you by your health care provider. Make sure you discuss any questions you have with your health care provider. Document Revised: 07/02/2020 Document Reviewed: 07/02/2020 Elsevier Patient Education  2024 Elsevier Inc.  

## 2022-12-13 LAB — COMPLETE METABOLIC PANEL WITH GFR
AG Ratio: 1.8 (calc) (ref 1.0–2.5)
ALT: 7 U/L (ref 6–29)
AST: 13 U/L (ref 10–35)
Albumin: 4.4 g/dL (ref 3.6–5.1)
Alkaline phosphatase (APISO): 42 U/L (ref 37–153)
BUN: 11 mg/dL (ref 7–25)
CO2: 28 mmol/L (ref 20–32)
Calcium: 9.7 mg/dL (ref 8.6–10.4)
Chloride: 103 mmol/L (ref 98–110)
Creat: 0.75 mg/dL (ref 0.50–1.05)
Globulin: 2.5 g/dL (ref 1.9–3.7)
Glucose, Bld: 98 mg/dL (ref 65–99)
Potassium: 4.6 mmol/L (ref 3.5–5.3)
Sodium: 139 mmol/L (ref 135–146)
Total Bilirubin: 0.9 mg/dL (ref 0.2–1.2)
Total Protein: 6.9 g/dL (ref 6.1–8.1)
eGFR: 91 mL/min/{1.73_m2} (ref >=60)

## 2022-12-13 LAB — LIPID PANEL
Cholesterol: 197 mg/dL (ref <200)
HDL: 70 mg/dL (ref >=50)
LDL Cholesterol (Calc): 111 mg/dL — ABNORMAL HIGH
Non-HDL Cholesterol (Calc): 127 mg/dL (ref <130)
Total CHOL/HDL Ratio: 2.8 (calc) (ref <5.0)
Triglycerides: 69 mg/dL (ref <150)

## 2022-12-13 LAB — CBC
HCT: 42.8 % (ref 35.0–45.0)
Hemoglobin: 14.1 g/dL (ref 11.7–15.5)
MCH: 30.1 pg (ref 27.0–33.0)
MCHC: 32.9 g/dL (ref 32.0–36.0)
MCV: 91.3 fL (ref 80.0–100.0)
MPV: 10.2 fL (ref 7.5–12.5)
Platelets: 277 10*3/uL (ref 140–400)
RBC: 4.69 10*6/uL (ref 3.80–5.10)
RDW: 12.3 % (ref 11.0–15.0)
WBC: 4.1 10*3/uL (ref 3.8–10.8)

## 2022-12-13 LAB — HEMOGLOBIN A1C
Hgb A1c MFr Bld: 5.1 %{Hb} (ref <5.7)
Mean Plasma Glucose: 100 mg/dL
eAG (mmol/L): 5.5 mmol/L

## 2022-12-16 ENCOUNTER — Telehealth: Payer: Self-pay

## 2022-12-16 DIAGNOSIS — Z1211 Encounter for screening for malignant neoplasm of colon: Secondary | ICD-10-CM

## 2022-12-16 NOTE — Telephone Encounter (Signed)
Gastroenterology Pre-Procedure Review  Request Date: TBD Requesting Physician: Dr. Jodelle Gross  PATIENT REVIEW QUESTIONS: The patient responded to the following health history questions as indicated:    1. Are you having any GI issues? no 2. Do you have a personal history of Polyps? no 3. Do you have a family history of Colon Cancer or Polyps? no 4. Diabetes Mellitus? no 5. Joint replacements in the past 12 months?no 6. Major health problems in the past 3 months?no 7. Any artificial heart valves, MVP, or defibrillator?no    MEDICATIONS & ALLERGIES:    Patient reports the following regarding taking any anticoagulation/antiplatelet therapy:   Plavix, Coumadin, Eliquis, Xarelto, Lovenox, Pradaxa, Brilinta, or Effient? no Aspirin? no  Patient confirms/reports the following medications:  Current Outpatient Medications  Medication Sig Dispense Refill   Calcium-Magnesium-Vitamin D (CALCIUM 1200+D3 PO) Take by mouth.     letrozole (FEMARA) 2.5 MG tablet Take 1 tablet (2.5 mg total) by mouth daily. 90 tablet 3   zolpidem (AMBIEN) 5 MG tablet Take 1 tablet (5 mg total) by mouth at bedtime as needed for sleep. 30 tablet 0   No current facility-administered medications for this visit.    Patient confirms/reports the following allergies:  No Known Allergies  No orders of the defined types were placed in this encounter.   AUTHORIZATION INFORMATION Primary Insurance: 1D#: Group #:  Secondary Insurance: 1D#: Group #:  SCHEDULE INFORMATION: Date: TBD Time: Location: [facility]

## 2022-12-22 LAB — CYTOLOGY - PAP
Comment: NEGATIVE
Diagnosis: NEGATIVE
High risk HPV: NEGATIVE

## 2022-12-24 ENCOUNTER — Other Ambulatory Visit: Payer: Self-pay

## 2022-12-24 ENCOUNTER — Telehealth: Payer: Self-pay

## 2022-12-24 ENCOUNTER — Encounter: Payer: Self-pay | Admitting: *Deleted

## 2022-12-24 DIAGNOSIS — Z1211 Encounter for screening for malignant neoplasm of colon: Secondary | ICD-10-CM

## 2022-12-24 MED ORDER — NA SULFATE-K SULFATE-MG SULF 17.5-3.13-1.6 GM/177ML PO SOLN
1.0000 | Freq: Once | ORAL | 0 refills | Status: AC
  Filled 2022-12-24: qty 354, 1d supply, fill #0

## 2022-12-24 NOTE — Telephone Encounter (Signed)
Gastroenterology Pre-Procedure Review   Request Date: 12/09 Requesting Physician: Dr. Servando Snare   PATIENT REVIEW QUESTIONS: The patient responded to the following health history questions as indicated:     1. Are you having any GI issues? no 2. Do you have a personal history of Polyps? no 3. Do you have a family history of Colon Cancer or Polyps? no 4. Diabetes Mellitus? no 5. Joint replacements in the past 12 months?no 6. Major health problems in the past 3 months?no 7. Any artificial heart valves, MVP, or defibrillator?no    MEDICATIONS & ALLERGIES:    Patient reports the following regarding taking any anticoagulation/antiplatelet therapy:   Plavix, Coumadin, Eliquis, Xarelto, Lovenox, Pradaxa, Brilinta, or Effient? no Aspirin? no   Patient confirms/reports the following medications:        Current Outpatient Medications  Medication Sig Dispense Refill   Calcium-Magnesium-Vitamin D (CALCIUM 1200+D3 PO) Take by mouth.       letrozole (FEMARA) 2.5 MG tablet Take 1 tablet (2.5 mg total) by mouth daily. 90 tablet 3   zolpidem (AMBIEN) 5 MG tablet Take 1 tablet (5 mg total) by mouth at bedtime as needed for sleep. 30 tablet 0

## 2022-12-24 NOTE — Telephone Encounter (Signed)
Pt requesting call back to schedule colonoscopy.

## 2023-01-02 ENCOUNTER — Other Ambulatory Visit: Payer: Self-pay | Admitting: Internal Medicine

## 2023-01-02 ENCOUNTER — Other Ambulatory Visit: Payer: Self-pay

## 2023-01-02 MED ORDER — ZOLPIDEM TARTRATE 5 MG PO TABS
5.0000 mg | ORAL_TABLET | Freq: Every evening | ORAL | 0 refills | Status: DC | PRN
  Filled 2023-01-02: qty 30, 30d supply, fill #0

## 2023-01-21 ENCOUNTER — Encounter: Payer: Self-pay | Admitting: Gastroenterology

## 2023-02-02 ENCOUNTER — Ambulatory Visit
Admission: RE | Admit: 2023-02-02 | Discharge: 2023-02-02 | Disposition: A | Discharge status: Home / Self Care | Payer: 59 | Source: Ambulatory Visit | Attending: Gastroenterology | Admitting: Gastroenterology

## 2023-02-02 ENCOUNTER — Ambulatory Visit: Payer: 59 | Admitting: Anesthesiology

## 2023-02-02 ENCOUNTER — Encounter: Payer: Self-pay | Admitting: Gastroenterology

## 2023-02-02 ENCOUNTER — Other Ambulatory Visit: Payer: Self-pay | Admitting: Internal Medicine

## 2023-02-02 ENCOUNTER — Encounter
Admission: RE | Disposition: A | Discharge status: Home / Self Care | Payer: Self-pay | Source: Ambulatory Visit | Attending: Gastroenterology

## 2023-02-02 ENCOUNTER — Other Ambulatory Visit: Payer: Self-pay

## 2023-02-02 DIAGNOSIS — K635 Polyp of colon: Secondary | ICD-10-CM | POA: Diagnosis not present

## 2023-02-02 DIAGNOSIS — Z1211 Encounter for screening for malignant neoplasm of colon: Secondary | ICD-10-CM | POA: Insufficient documentation

## 2023-02-02 DIAGNOSIS — D122 Benign neoplasm of ascending colon: Secondary | ICD-10-CM | POA: Insufficient documentation

## 2023-02-02 DIAGNOSIS — Z853 Personal history of malignant neoplasm of breast: Secondary | ICD-10-CM | POA: Insufficient documentation

## 2023-02-02 DIAGNOSIS — Z87891 Personal history of nicotine dependence: Secondary | ICD-10-CM | POA: Insufficient documentation

## 2023-02-02 DIAGNOSIS — D124 Benign neoplasm of descending colon: Secondary | ICD-10-CM | POA: Insufficient documentation

## 2023-02-02 DIAGNOSIS — K641 Second degree hemorrhoids: Secondary | ICD-10-CM | POA: Insufficient documentation

## 2023-02-02 DIAGNOSIS — K219 Gastro-esophageal reflux disease without esophagitis: Secondary | ICD-10-CM | POA: Diagnosis not present

## 2023-02-02 HISTORY — PX: COLONOSCOPY WITH PROPOFOL: SHX5780

## 2023-02-02 HISTORY — PX: POLYPECTOMY: SHX5525

## 2023-02-02 SURGERY — COLONOSCOPY WITH PROPOFOL
Anesthesia: General | Site: Rectum

## 2023-02-02 MED ORDER — SODIUM CHLORIDE 0.9 % IV SOLN: INTRAVENOUS | Status: DC

## 2023-02-02 MED ORDER — STERILE WATER FOR IRRIGATION IR SOLN
Status: DC | PRN
  Administered 2023-02-02: 50 mL

## 2023-02-02 MED ORDER — LIDOCAINE HCL (CARDIAC) PF 100 MG/5ML IV SOSY
PREFILLED_SYRINGE | INTRAVENOUS | Status: DC | PRN
  Administered 2023-02-02: 50 mg via INTRAVENOUS

## 2023-02-02 MED ORDER — PROPOFOL 10 MG/ML IV BOLUS
INTRAVENOUS | Status: DC | PRN
  Administered 2023-02-02 (×4): 50 mg via INTRAVENOUS
  Administered 2023-02-02: 90 mg via INTRAVENOUS
  Administered 2023-02-02: 30 mg via INTRAVENOUS

## 2023-02-02 MED ORDER — LACTATED RINGERS IV SOLN: Freq: Once | INTRAVENOUS | Status: AC

## 2023-02-02 MED ORDER — LACTATED RINGERS IV SOLN: INTRAVENOUS | Status: DC | PRN

## 2023-02-02 MED ORDER — PROPOFOL 10 MG/ML IV BOLUS
INTRAVENOUS | Status: AC
  Filled 2023-02-02: qty 20

## 2023-02-02 SURGICAL SUPPLY — 6 items
GOWN CVR UNV OPN BCK APRN NK (MISCELLANEOUS) ×4 IMPLANT
KIT PRC NS LF DISP ENDO (KITS) ×2 IMPLANT
MANIFOLD NEPTUNE II (INSTRUMENTS) ×2 IMPLANT
SNARE COLD EXACTO (MISCELLANEOUS) IMPLANT
TRAP ETRAP POLY (MISCELLANEOUS) IMPLANT
WATER STERILE IRR 250ML POUR (IV SOLUTION) ×2 IMPLANT

## 2023-02-02 NOTE — Anesthesia Postprocedure Evaluation (Signed)
Anesthesia Post Note  Patient: Heather Jensen  Procedure(s) Performed: COLONOSCOPY WITH PROPOFOL (Rectum) POLYPECTOMY (Rectum)  Patient location during evaluation: PACU Anesthesia Type: General Level of consciousness: awake and alert Pain management: pain level controlled Vital Signs Assessment: post-procedure vital signs reviewed and stable Respiratory status: spontaneous breathing, nonlabored ventilation, respiratory function stable and patient connected to nasal cannula oxygen Cardiovascular status: blood pressure returned to baseline and stable Postop Assessment: no apparent nausea or vomiting Anesthetic complications: no   No notable events documented.   Last Vitals:  Vitals:   02/02/23 0923 02/02/23 0930  BP: (!) 121/47 115/77  Pulse: 68 76  Resp: 16 17  Temp:    SpO2: 99% 100%    Last Pain:  Vitals:   02/02/23 0930  TempSrc:   PainSc: 0-No pain                 Lakeith Careaga C Wrenly Lauritsen

## 2023-02-02 NOTE — Anesthesia Preprocedure Evaluation (Addendum)
Anesthesia Evaluation  Patient identified by MRN, date of birth, ID band Patient awake    Reviewed: Allergy & Precautions, H&P , NPO status , Patient's Chart, lab work & pertinent test results  Airway Mallampati: I  TM Distance: >3 FB Neck ROM: Full   Comment: Beautiful Mallampati I airway Dental no notable dental hx.  Has some crowns, intact perfect teeth:   Pulmonary former smoker   Pulmonary exam normal breath sounds clear to auscultation       Cardiovascular negative cardio ROS Normal cardiovascular exam Rhythm:Regular Rate:Normal     Neuro/Psych negative neurological ROS  negative psych ROS   GI/Hepatic negative GI ROS, Neg liver ROS,,,  Endo/Other  negative endocrine ROS    Renal/GU negative Renal ROS  negative genitourinary   Musculoskeletal negative musculoskeletal ROS (+)    Abdominal   Peds negative pediatric ROS (+)  Hematology negative hematology ROS (+)   Anesthesia Other Findings Anxiety  Depression Breast cancer  right breast (HCC)  GERD (gastroesophageal reflux disease) Personal history of radiation therapy  Hx adenomatous polyp    Reproductive/Obstetrics negative OB ROS                             Anesthesia Physical Anesthesia Plan  ASA: 3  Anesthesia Plan: General   Post-op Pain Management:    Induction: Intravenous  PONV Risk Score and Plan:   Airway Management Planned: Natural Airway and Nasal Cannula  Additional Equipment:   Intra-op Plan:   Post-operative Plan:   Informed Consent: I have reviewed the patients History and Physical, chart, labs and discussed the procedure including the risks, benefits and alternatives for the proposed anesthesia with the patient or authorized representative who has indicated his/her understanding and acceptance.     Dental Advisory Given  Plan Discussed with: Anesthesiologist, CRNA and Surgeon  Anesthesia  Plan Comments: (Patient consented for risks of anesthesia including but not limited to:  - adverse reactions to medications - risk of airway placement if required - damage to eyes, teeth, lips or other oral mucosa - nerve damage due to positioning  - sore throat or hoarseness - Damage to heart, brain, nerves, lungs, other parts of body or loss of life  Patient voiced understanding and assent.)        Anesthesia Quick Evaluation

## 2023-02-02 NOTE — Transfer of Care (Signed)
Immediate Anesthesia Transfer of Care Note  Patient: Heather Jensen  Procedure(s) Performed: COLONOSCOPY WITH PROPOFOL (Rectum) POLYPECTOMY (Rectum)  Patient Location: PACU  Anesthesia Type: General  Level of Consciousness: awake, alert  and patient cooperative  Airway and Oxygen Therapy: Patient Spontanous Breathing and Patient connected to supplemental oxygen  Post-op Assessment: Post-op Vital signs reviewed, Patient's Cardiovascular Status Stable, Respiratory Function Stable, Patent Airway and No signs of Nausea or vomiting  Post-op Vital Signs: Reviewed and stable  Complications: No notable events documented.

## 2023-02-02 NOTE — Op Note (Signed)
[facility] Gastroenterology Patient Name: Heather Jensen Procedure Date: 02/02/2023 8:49 AM MRN: [id number] Account #: [id number] Date of Birth: [birth year] Admit Type: Outpatient Age: 62 Room: [facility] OR ROOM 01 Gender: Female Note Status: Finalized Instrument Name: [id number] Procedure:             Colonoscopy Indications:           Screening for colorectal malignant neoplasm Providers:             Midge Minium MD, MD Referring MD:          Lorre Munroe (Referring MD) Medicines:             Propofol per Anesthesia Complications:         No immediate complications. Procedure:             Pre-Anesthesia Assessment:                        - Prior to the procedure, a History and Physical was                         performed, and patient medications and allergies were                         reviewed. The patient's tolerance of previous                         anesthesia was also reviewed. The risks and benefits                         of the procedure and the sedation options and risks                         were discussed with the patient. All questions were                         answered, and informed consent was obtained. Prior                         Anticoagulants: The patient has taken no anticoagulant                         or antiplatelet agents. ASA Grade Assessment: II - A                         patient with mild systemic disease. After reviewing                         the risks and benefits, the patient was deemed in                         satisfactory condition to undergo the procedure.                        After obtaining informed consent, the colonoscope was                         passed under direct vision. Throughout the procedure,  the patient's blood pressure, pulse, and oxygen                         saturations were monitored continuously. The                         Colonoscope was introduced through the anus and                          advanced to the the cecum, identified by appendiceal                         orifice and ileocecal valve. The colonoscopy was                         performed without difficulty. The patient tolerated                         the procedure well. The quality of the bowel                         preparation was excellent. Findings:      The perianal and digital rectal examinations were normal.      A 6 mm polyp was found in the ascending colon. The polyp was sessile.       The polyp was removed with a cold snare. Resection and retrieval were       complete.      Two sessile polyps were found in the descending colon. The polyps were 3       to 6 mm in size. These polyps were removed with a cold snare. Resection       and retrieval were complete.      Non-bleeding internal hemorrhoids were found during retroflexion. The       hemorrhoids were Grade II (internal hemorrhoids that prolapse but reduce       spontaneously). Impression:            - One 6 mm polyp in the ascending colon, removed with                         a cold snare. Resected and retrieved.                        - Two 3 to 6 mm polyps in the descending colon,                         removed with a cold snare. Resected and retrieved.                        - Non-bleeding internal hemorrhoids. Recommendation:        - Discharge patient to home.                        - Resume previous diet.                        - Continue present medications.                        - Await  pathology results.                        - If the pathology report reveals adenomatous tissue,                         then repeat the colonoscopy for surveillance in 5                         years. Procedure Code(s):     --- Professional ---                        [phone], Colonoscopy, flexible; with removal of                         tumor(s), polyp(s), or other lesion(s) by snare                         technique Diagnosis  Code(s):     --- Professional ---                        Z12.11, Encounter for screening for malignant neoplasm                         of colon                        D12.2, Benign neoplasm of ascending colon CPT copyright 2022 American Medical Association. All rights reserved. The codes documented in this report are preliminary and upon coder review may  be revised to meet current compliance requirements. Midge Minium MD, MD 02/02/2023 9:20:48 AM This report has been signed electronically. Number of Addenda: 0 Note Initiated On: 02/02/2023 8:49 AM Scope Withdrawal Time: 0 hours 12 minutes 11 seconds  Total Procedure Duration: 0 hours 20 minutes 56 seconds  Estimated Blood Loss:  Estimated blood loss: none.      [facility]

## 2023-02-02 NOTE — H&P (Signed)
Midge Minium, MD [facility] [address] [place], [place] [address] Phone: [phone] Fax : [phone]  Primary Care Physician:  Lorre Munroe, NP Primary Gastroenterologist:  Dr. Servando Snare  Pre-Procedure History & Physical: HPI:  Heather Jensen is a 62 y.o. female is here for a screening colonoscopy.   Past Medical History:  Diagnosis Date   Adenomatous polyp 2019   Anxiety    Breast cancer (HCC) 03/22/2021   Chicken pox    Depression    GERD (gastroesophageal reflux disease)    Personal history of radiation therapy     Past Surgical History:  Procedure Laterality Date   BREAST BIOPSY Left 2010   biopsy of a lump   BREAST LUMPECTOMY     BREAST LUMPECTOMY WITH SENTINEL LYMPH NODE BIOPSY Right 04/10/2021   Procedure: BREAST LUMPECTOMY WITH SENTINEL LYMPH NODE BX;  Surgeon: Earline Mayotte, MD;  Location: [facility] ORS;  Service: General;  Laterality: Right;   CESAREAN SECTION  1989   COLONOSCOPY  2019   Adenomatous Polyp: CBF 12/2017: Recall ltr mailed 12/11/17   TUBAL LIGATION  2000    Prior to Admission medications   Medication Sig Start Date End Date Taking? Authorizing Provider  Calcium-Magnesium-Vitamin D (CALCIUM 1200+D3 PO) Take by mouth.   Yes [provider]  letrozole (FEMARA) 2.5 MG tablet Take 1 tablet (2.5 mg total) by mouth daily. 02/19/22  Yes Jeralyn Ruths, MD  zolpidem (AMBIEN) 5 MG tablet Take 1 tablet (5 mg total) by mouth at bedtime as needed for sleep. 01/02/23  Yes Lorre Munroe, NP    Allergies as of 12/24/2022   (No Known Allergies)    Family History  Problem Relation Age of Onset   Hyperlipidemia Mother    Alzheimer's disease Mother    Hypertension Father    Heart disease Father    Melanoma Brother 47   Lung cancer Paternal Grandfather    Breast cancer Neg Hx    Stroke Neg Hx     Social History   Socioeconomic History   Marital status: Legally Separated    Spouse name: Not on file   Number of  children: 1   Years of education: Not on file   Highest education level: Bachelor's degree (e.g., BA, AB, BS)  Occupational History   Not on file  Tobacco Use   Smoking status: Former    Current packs/day: 0.00    Average packs/day: 0.5 packs/day for 30.0 years (15.0 ttl pk-yrs)    Types: Cigarettes    Start date: 04/10/1984    Quit date: 04/10/2014    Years since quitting: 8.8   Smokeless tobacco: Never  Vaping Use   Vaping status: Never Used  Substance and Sexual Activity   Alcohol use: Not Currently    Alcohol/week: 1.0 standard drink of alcohol    Types: 1 Glasses of wine per week    Comment: on special occassions   Drug use: No   Sexual activity: Yes    Partners: Male    Birth control/protection: None  Other Topics Concern   Not on file  Social History Narrative   Not on file   Social Determinants of Health   Financial Resource Strain: Low Risk  (04/10/2017)   Overall Financial Resource Strain (CARDIA)    Difficulty of Paying Living Expenses: Not hard at all  Food Insecurity: No Food Insecurity (04/10/2017)   Hunger Vital Sign    Worried About Running Out of Food in the Last  Year: Never true    Ran Out of Food in the Last Year: Never true  Transportation Needs: No Transportation Needs (04/10/2017)   PRAPARE - Administrator, Civil Service (Medical): No    Lack of Transportation (Non-Medical): No  Physical Activity: Sufficiently Active (04/10/2017)   Exercise Vital Sign    Days of Exercise per Week: 6 days    Minutes of Exercise per Session: 40 min  Stress: No Stress Concern Present (04/10/2017)   [employer] of Occupational Health - Occupational Stress Questionnaire    Feeling of Stress : Only a little  Social Connections: Unknown (04/10/2017)   Social Connection and Isolation Panel [NHANES]    Frequency of Communication with Friends and Family: Patient declined    Frequency of Social Gatherings with Friends and Family: Patient declined    Attends  Religious Services: Patient declined    Database administrator or Organizations: Patient declined    Attends Banker Meetings: Patient declined    Marital Status: Patient declined  Intimate Partner Violence: Not At Risk (04/10/2017)   Humiliation, Afraid, Rape, and Kick questionnaire    Fear of Current or Ex-Partner: No    Emotionally Abused: No    Physically Abused: No    Sexually Abused: No    Review of Systems: See HPI, otherwise negative ROS  Physical Exam: BP (!) 144/92   Pulse 91   Temp 98.2 F (36.8 C) (Temporal)   Resp 18   Ht 5' 5.5" (1.664 m)   Wt 69.9 kg   SpO2 99%   BMI 25.24 kg/m  General:   Alert,  pleasant and cooperative in NAD Head:  Normocephalic and atraumatic. Neck:  Supple; no masses or thyromegaly. Lungs:  Clear throughout to auscultation.    Heart:  Regular rate and rhythm. Abdomen:  Soft, nontender and nondistended. Normal bowel sounds, without guarding, and without rebound.   Neurologic:  Alert and  oriented x4;  grossly normal neurologically.  Impression/Plan: Heather Jensen is now here to undergo a screening colonoscopy.  Risks, benefits, and alternatives regarding colonoscopy have been reviewed with the patient.  Questions have been answered.  All parties agreeable.

## 2023-02-03 ENCOUNTER — Other Ambulatory Visit: Payer: Self-pay | Admitting: Internal Medicine

## 2023-02-03 ENCOUNTER — Other Ambulatory Visit: Payer: Self-pay

## 2023-02-03 ENCOUNTER — Encounter: Payer: Self-pay | Admitting: Gastroenterology

## 2023-02-04 ENCOUNTER — Encounter: Payer: Self-pay | Admitting: Gastroenterology

## 2023-02-04 ENCOUNTER — Other Ambulatory Visit: Payer: Self-pay

## 2023-02-04 LAB — SURGICAL PATHOLOGY

## 2023-02-04 MED FILL — Zolpidem Tartrate Tab 5 MG: ORAL | 30 days supply | Qty: 30 | Fill #0 | Status: AC

## 2023-02-04 NOTE — Telephone Encounter (Signed)
Requested medication (s) are due for refill today- yes  Requested medication (s) are on the active medication list -yes  Future visit scheduled -yes  Last refill: 01/02/23 #30  Notes to clinic: non delegated Rx  Requested Prescriptions  Pending Prescriptions Disp Refills   zolpidem (AMBIEN) 5 MG tablet 30 tablet 0    Sig: Take 1 tablet (5 mg total) by mouth at bedtime as needed for sleep.     Not Delegated - Psychiatry:  Anxiolytics/Hypnotics Failed - 02/03/2023  9:34 AM      Failed - This refill cannot be delegated      Failed - Urine Drug Screen completed in last 360 days      Passed - Valid encounter within last 6 months    Recent Outpatient Visits           1 month ago Encounter for general adult medical examination with abnormal findings   [place] [facility] [place], [place] W, NP   8 months ago Constipation due to slow transit   [place] [facility] [place], Salvadore Oxford, NP   1 year ago Encounter for general adult medical examination with abnormal findings   [place] [place], Salvadore Oxford, NP   1 year ago Situational anxiety   [place] [facility] [place], Salvadore Oxford, NP   2 years ago Encounter for general adult medical examination with abnormal findings   [place] [facility] [place], Salvadore Oxford, NP       Future Appointments             In 4 months [place], Salvadore Oxford, NP [facility], [facility]               Requested Prescriptions  Pending Prescriptions Disp Refills   zolpidem (AMBIEN) 5 MG tablet 30 tablet 0    Sig: Take 1 tablet (5 mg total) by mouth at bedtime as needed for sleep.     Not Delegated - Psychiatry:  Anxiolytics/Hypnotics Failed - 02/03/2023  9:34 AM      Failed - This refill cannot be delegated      Failed - Urine Drug Screen completed in last 360 days      Passed - Valid encounter within last 6 months    Recent Outpatient  Visits           1 month ago Encounter for general adult medical examination with abnormal findings   [place] [facility] [place], [place], NP   8 months ago Constipation due to slow transit   [place] [facility] [place], Salvadore Oxford, NP   1 year ago Encounter for general adult medical examination with abnormal findings   [place] [facility] [place], Salvadore Oxford, NP   1 year ago Situational anxiety   [place] [facility] [place], Salvadore Oxford, NP   2 years ago Encounter for general adult medical examination with abnormal findings   [place] [facility] [place], Salvadore Oxford, NP       Future Appointments             In 4 months [place], Salvadore Oxford, NP  [facility], [facility]

## 2023-02-12 DIAGNOSIS — H11153 Pinguecula, bilateral: Secondary | ICD-10-CM | POA: Diagnosis not present

## 2023-02-12 DIAGNOSIS — H2513 Age-related nuclear cataract, bilateral: Secondary | ICD-10-CM | POA: Diagnosis not present

## 2023-02-12 DIAGNOSIS — M3501 Sicca syndrome with keratoconjunctivitis: Secondary | ICD-10-CM | POA: Diagnosis not present

## 2023-02-20 ENCOUNTER — Encounter: Payer: Self-pay | Admitting: Genetic Counselor

## 2023-03-06 ENCOUNTER — Ambulatory Visit
Admission: RE | Admit: 2023-03-06 | Discharge: 2023-03-06 | Disposition: A | Discharge status: Home / Self Care | Payer: Commercial Managed Care - PPO | Source: Ambulatory Visit | Attending: Oncology | Admitting: Oncology

## 2023-03-06 ENCOUNTER — Other Ambulatory Visit: Payer: Self-pay | Admitting: Internal Medicine

## 2023-03-06 ENCOUNTER — Other Ambulatory Visit: Payer: Self-pay

## 2023-03-06 DIAGNOSIS — R928 Other abnormal and inconclusive findings on diagnostic imaging of breast: Secondary | ICD-10-CM | POA: Diagnosis not present

## 2023-03-06 DIAGNOSIS — C50511 Malignant neoplasm of lower-outer quadrant of right female breast: Secondary | ICD-10-CM | POA: Insufficient documentation

## 2023-03-06 DIAGNOSIS — R92333 Mammographic heterogeneous density, bilateral breasts: Secondary | ICD-10-CM | POA: Diagnosis not present

## 2023-03-09 ENCOUNTER — Other Ambulatory Visit: Payer: Self-pay

## 2023-03-09 MED FILL — Zolpidem Tartrate Tab 5 MG: ORAL | 30 days supply | Qty: 30 | Fill #0 | Status: CN

## 2023-03-09 MED FILL — Zolpidem Tartrate Tab 5 MG: ORAL | 30 days supply | Qty: 30 | Fill #0 | Status: AC

## 2023-03-09 NOTE — Telephone Encounter (Signed)
 Requested medications are due for refill today.  yes  Requested medications are on the active medications list.  yes  Last refill. 02/04/2023 #30 0 rf  Future visit scheduled.   yes  Notes to clinic.  Refill not delegated.    Requested Prescriptions  Pending Prescriptions Disp Refills   zolpidem  (AMBIEN ) 5 MG tablet 30 tablet 0    Sig: Take 1 tablet (5 mg total) by mouth at bedtime as needed for sleep.     Not Delegated - Psychiatry:  Anxiolytics/Hypnotics Failed - 03/09/2023  3:23 PM      Failed - This refill cannot be delegated      Failed - Urine Drug Screen completed in last 360 days      Passed - Valid encounter within last 6 months    Recent Outpatient Visits           2 months ago Encounter for general adult medical examination with abnormal findings   [place] [facility] [place], [place] W, NP   9 months ago Constipation due to slow transit   [place] [facility] [place], Angeline ORN, NP   1 year ago Encounter for general adult medical examination with abnormal findings   [place] [facility] [place], Angeline ORN, NP   1 year ago Situational anxiety   [place] [facility] [place], Angeline ORN, NP   2 years ago Encounter for general adult medical examination with abnormal findings   [place] [facility] [place], Angeline ORN, NP       Future Appointments             In 3 months Ba[place], Angeline ORN, NP [place] [facility] [place], [place]

## 2023-03-10 ENCOUNTER — Other Ambulatory Visit: Payer: Self-pay

## 2023-03-11 ENCOUNTER — Other Ambulatory Visit: Payer: Self-pay

## 2023-03-16 ENCOUNTER — Inpatient Hospital Stay: Payer: Commercial Managed Care - PPO | Attending: Oncology | Admitting: Oncology

## 2023-03-16 ENCOUNTER — Encounter: Payer: Self-pay | Admitting: Oncology

## 2023-03-16 VITALS — BP 132/65 | HR 68 | Temp 97.8°F | Resp 16 | Ht 65.5 in | Wt 155.9 lb

## 2023-03-16 DIAGNOSIS — Z923 Personal history of irradiation: Secondary | ICD-10-CM | POA: Diagnosis not present

## 2023-03-16 DIAGNOSIS — Z170 Estrogen receptor positive status [ER+]: Secondary | ICD-10-CM | POA: Insufficient documentation

## 2023-03-16 DIAGNOSIS — Z79811 Long term (current) use of aromatase inhibitors: Secondary | ICD-10-CM | POA: Diagnosis not present

## 2023-03-16 DIAGNOSIS — Z801 Family history of malignant neoplasm of trachea, bronchus and lung: Secondary | ICD-10-CM | POA: Diagnosis not present

## 2023-03-16 DIAGNOSIS — C50312 Malignant neoplasm of lower-inner quadrant of left female breast: Secondary | ICD-10-CM | POA: Insufficient documentation

## 2023-03-16 DIAGNOSIS — M810 Age-related osteoporosis without current pathological fracture: Secondary | ICD-10-CM | POA: Diagnosis not present

## 2023-03-16 DIAGNOSIS — C50511 Malignant neoplasm of lower-outer quadrant of right female breast: Secondary | ICD-10-CM | POA: Diagnosis not present

## 2023-03-16 DIAGNOSIS — Z1732 Human epidermal growth factor receptor 2 negative status: Secondary | ICD-10-CM | POA: Diagnosis not present

## 2023-03-16 DIAGNOSIS — Z1721 Progesterone receptor positive status: Secondary | ICD-10-CM | POA: Insufficient documentation

## 2023-03-16 NOTE — Progress Notes (Signed)
[facility]  [phone] [phone]  ID: Jani Files OB: April 17, 1960  MR#: [id number]  BTD#:[id number]  Patient Care Team: Lorre Munroe, NP as PCP - General (Internal Medicine) Jeralyn Ruths, MD as Consulting Physician (Oncology) Carmina Miller, MD as Consulting Physician (Radiation Oncology)  CHIEF COMPLAINT: Pathologic stage Ia ER/PR positive, HER-2 negative invasive carcinoma of the lower outer quadrant of the right breast.  Oncotype score 13, low risk.  INTERVAL HISTORY: Patient returns to clinic today for routine 49-month evaluation.  She continues to feel well and remains asymptomatic.  She is tolerating letrozole without significant side effects.  She has no neurologic complaints.  She denies any recent fevers or illnesses.  She has a good appetite and denies weight loss.  She has no chest pain, shortness of breath, cough, or hemoptysis.  She denies any nausea, vomiting, constipation, or diarrhea.  She has no urinary complaints.  Patient offers no specific complaints today.  REVIEW OF SYSTEMS:   Review of Systems  Constitutional: Negative.  Negative for fever, malaise/fatigue and weight loss.  Respiratory: Negative.  Negative for cough, hemoptysis and shortness of breath.   Cardiovascular: Negative.  Negative for chest pain and leg swelling.  Gastrointestinal: Negative.  Negative for abdominal pain.  Genitourinary: Negative.  Negative for dysuria.  Musculoskeletal: Negative.  Negative for back pain.  Skin: Negative.  Negative for rash.  Neurological: Negative.  Negative for dizziness, focal weakness, weakness and headaches.  Psychiatric/Behavioral: Negative.  The patient is not nervous/anxious.     As per HPI. Otherwise, a complete review of systems is negative.  PAST MEDICAL HISTORY: Past Medical History:  Diagnosis Date   Adenomatous polyp 2019   Anxiety    Breast cancer (HCC) 03/22/2021   Chicken pox     Depression    Genetic testing 07/09/2021   Negative genetic testing. No pathogenic variants identified on the [facility] CancerNext-Expanded+RNA panel. The report date is 07/08/2021.     The CancerNext-Expanded + RNAinsight gene panel offered by [employer] and includes sequencing and rearrangement analysis for the following 77 genes: IP, ALK, APC*, ATM*, AXIN2, BAP1, BARD1, BLM, BMPR1A, BRCA1*, BRCA2*, BRIP1*, CDC73, CDH1*,CDK4, CDKN1B, CDKN   GERD (gastroesophageal reflux disease)    Personal history of radiation therapy     PAST SURGICAL HISTORY: Past Surgical History:  Procedure Laterality Date   BREAST BIOPSY Left 2010   biopsy of a lump   BREAST LUMPECTOMY     BREAST LUMPECTOMY WITH SENTINEL LYMPH NODE BIOPSY Right 04/10/2021   Procedure: BREAST LUMPECTOMY WITH SENTINEL LYMPH NODE BX;  Surgeon: Earline Mayotte, MD;  Location: [facility] ORS;  Service: General;  Laterality: Right;   CESAREAN SECTION  1989   COLONOSCOPY  2019   Adenomatous Polyp: CBF 12/2017: Recall ltr mailed 12/11/17   COLONOSCOPY WITH PROPOFOL N/A 02/02/2023   Procedure: COLONOSCOPY WITH PROPOFOL;  Surgeon: Midge Minium, MD;  [facility];  Service: Endoscopy;  Laterality: N/A;   POLYPECTOMY  02/02/2023   Procedure: POLYPECTOMY;  Surgeon: Midge Minium, MD;  [facility];  Service: Endoscopy;;   TUBAL LIGATION  2000    FAMILY HISTORY: Family History  Problem Relation Age of Onset   Hyperlipidemia Mother    Alzheimer's disease Mother    Hypertension Father    Heart disease Father    Melanoma Brother 68   Lung cancer Paternal Grandfather    Breast cancer Neg Hx    Stroke Neg Hx  ADVANCED DIRECTIVES (Y/N):  N  HEALTH MAINTENANCE: Social History   Tobacco Use   Smoking status: Former    Current packs/day: 0.00    Average packs/day: 0.5 packs/day for 30.0 years (15.0 ttl pk-yrs)    Types: Cigarettes    Start date: 04/10/1984    Quit date: 04/10/2014    Years since  quitting: 8.9   Smokeless tobacco: Never  Vaping Use   Vaping status: Never Used  Substance Use Topics   Alcohol use: Not Currently    Alcohol/week: 1.0 standard drink of alcohol    Types: 1 Glasses of wine per week    Comment: on special occassions   Drug use: No     Colonoscopy:  PAP:  Bone density:  Lipid panel:  No Known Allergies  Current Outpatient Medications  Medication Sig Dispense Refill   Calcium-Magnesium-Vitamin D (CALCIUM 1200+D3 PO) Take by mouth.     letrozole (FEMARA) 2.5 MG tablet Take 1 tablet (2.5 mg total) by mouth daily. 90 tablet 3   zolpidem (AMBIEN) 5 MG tablet Take 1 tablet (5 mg total) by mouth at bedtime as needed for sleep. 30 tablet 0   No current facility-administered medications for this visit.    OBJECTIVE: Vitals:   03/16/23 1303  BP: 132/65  Pulse: 68  Resp: 16  Temp: 97.8 F (36.6 C)  SpO2: 100%     Body mass index is 25.55 kg/m.    ECOG FS:0 - Asymptomatic  General: Well-developed, well-nourished, no acute distress. Eyes: Pink conjunctiva, anicteric sclera. HEENT: Normocephalic, moist mucous membranes. Lungs: No audible wheezing or coughing. Heart: Regular rate and rhythm. Abdomen: Soft, nontender, no obvious distention. Musculoskeletal: No edema, cyanosis, or clubbing. Neuro: Alert, answering all questions appropriately. Cranial nerves grossly intact. Skin: No rashes or petechiae noted. Psych: Normal affect.  LAB RESULTS:  Lab Results  Component Value Date   NA 139 12/12/2022   K 4.6 12/12/2022   CL 103 12/12/2022   CO2 28 12/12/2022   GLUCOSE 98 12/12/2022   BUN 11 12/12/2022   CREATININE 0.75 12/12/2022   CALCIUM 9.7 12/12/2022   PROT 6.9 12/12/2022   ALBUMIN 4.3 10/17/2015   AST 13 12/12/2022   ALT 7 12/12/2022   ALKPHOS 38 (L) 10/17/2015   BILITOT 0.9 12/12/2022   GFRNONAA 78 11/04/2019   GFRAA 90 11/04/2019    Lab Results  Component Value Date   WBC 4.1 12/12/2022   NEUTROABS 3,633 11/04/2019   HGB  14.1 12/12/2022   HCT 42.8 12/12/2022   MCV 91.3 12/12/2022   PLT 277 12/12/2022     STUDIES: MM DIAG BREAST TOMO BILATERAL Result Date: 03/06/2023 CLINICAL DATA:  Status post RIGHT lumpectomy in 2023 as well as radiation. Negative axillary lymph nodes. No margin. EXAM: DIGITAL DIAGNOSTIC BILATERAL MAMMOGRAM WITH TOMOSYNTHESIS AND CAD TECHNIQUE: Bilateral digital diagnostic mammography and breast tomosynthesis was performed. The images were evaluated with computer-aided detection. COMPARISON:  Previous exam(s). ACR Breast Density Category c: The breasts are heterogeneously dense, which may obscure small masses. FINDINGS: There is density and architectural distortion within the RIGHT breast, consistent with postsurgical changes. These are stable in comparison to prior. No suspicious mass, distortion, or microcalcifications are identified to suggest presence of malignancy. IMPRESSION: No mammographic evidence of malignancy bilaterally. RECOMMENDATION: Per protocol, as the patient is now 2 or more years status post lumpectomy, she may return to annual screening mammography in 1 year. However, given the history of breast cancer, the patient remains eligible for annual diagnostic  mammography if preferred. (Code:SM-B-01Y) I have discussed the findings and recommendations with the patient. If applicable, a reminder letter will be sent to the patient regarding the next appointment. BI-RADS CATEGORY  2: Benign. Electronically Signed   By: Meda Klinefelter M.D.   On: 03/06/2023 14:31    ASSESSMENT: Pathologic stage Ia ER/PR positive, HER-2 negative invasive carcinoma of the lower outer quadrant of the right breast.  Oncotype DX score 13, low risk.  PLAN:    Pathologic stage Ia ER/PR positive, HER-2 negative invasive carcinoma of the lower outer quadrant of the right breast: Patient underwent lumpectomy on April 10, 2021 confirming stage of disease.  Oncotype score was low risk, therefore chemotherapy was  not indicated.  Patient completed XRT on June 11, 2021.  Continue letrozole for total of 5 years completing treatment in April 2028.  Patient's most recent mammogram on March 06, 2023 was reported BI-RADS 2.  Repeat in January 2026.  Return to clinic in 6 months with video-assisted telemedicine visit.     Osteoporosis: Patient's most recent bone mineral density on September 03, 2022 reported T-score of -2.5.  This is slightly decreased from 1 year prior where her T-score was reported -2.4.  Previously, patient did not wish to initiate Fosamax and requested to hold treatment and continue calcium and vitamin D supplementation only.  She did agree that if she continues to have osteoporosis with her repeat bone mineral density in July 2025, she will initiate Fosamax at that time.  Return to clinic 6 months after her bone marrow density with video assisted telemedicine visit.  Patient expressed understanding and was in agreement with this plan. She also understands that She can call clinic at any time with any questions, concerns, or complaints.    Cancer Staging  Carcinoma of lower outer quadrant of right breast [facility]) Staging form: Breast, AJCC 8th Edition - Clinical stage from 04/01/2021: Stage IA (cT1b, cN0, cM0, G2, ER+, PR+, HER2-) - Signed by Jeralyn Ruths, MD on 04/03/2021 Stage prefix: Initial diagnosis Histologic grading system: 3 grade system   Jeralyn Ruths, MD   03/16/2023 2:22 PM

## 2023-03-17 DIAGNOSIS — C50511 Malignant neoplasm of lower-outer quadrant of right female breast: Secondary | ICD-10-CM | POA: Diagnosis not present

## 2023-03-17 DIAGNOSIS — Z170 Estrogen receptor positive status [ER+]: Secondary | ICD-10-CM | POA: Diagnosis not present

## 2023-03-24 ENCOUNTER — Other Ambulatory Visit: Payer: Self-pay | Admitting: Oncology

## 2023-03-24 ENCOUNTER — Other Ambulatory Visit: Payer: Self-pay

## 2023-03-24 MED ORDER — LETROZOLE 2.5 MG PO TABS
2.5000 mg | ORAL_TABLET | Freq: Every day | ORAL | 3 refills | Status: AC
  Filled 2023-03-24: qty 90, 90d supply, fill #0
  Filled 2023-08-15: qty 90, 90d supply, fill #1
  Filled 2023-11-16: qty 90, 90d supply, fill #2
  Filled 2024-01-25 – 2024-01-27 (×3): qty 90, 90d supply, fill #3

## 2023-04-08 ENCOUNTER — Other Ambulatory Visit: Payer: Self-pay | Admitting: Internal Medicine

## 2023-04-08 ENCOUNTER — Other Ambulatory Visit: Payer: Self-pay

## 2023-04-08 NOTE — Telephone Encounter (Signed)
Requested medication (s) are due for refill today: Yes  Requested medication (s) are on the active medication list: Yes  Last refill:  03/09/23  Future visit scheduled: Yes  Notes to clinic:  Unable to refill per protocol, cannot delegate.      Requested Prescriptions  Pending Prescriptions Disp Refills   zolpidem (AMBIEN) 5 MG tablet 30 tablet 0    Sig: Take 1 tablet (5 mg total) by mouth at bedtime as needed for sleep.     Not Delegated - Psychiatry:  Anxiolytics/Hypnotics Failed - 04/08/2023  1:06 PM      Failed - This refill cannot be delegated      Failed - Urine Drug Screen completed in last 360 days      Passed - Valid encounter within last 6 months    Recent Outpatient Visits           3 months ago Encounter for general adult medical examination with abnormal findings   [place] [facility] [place], [place] W, NP   10 months ago Constipation due to slow transit   [place] [facility] [place], Salvadore Oxford, NP   1 year ago Encounter for general adult medical examination with abnormal findings   [place] [facility] [place], Salvadore Oxford, NP   2 years ago Situational anxiety   [place] [facility] [place], Salvadore Oxford, NP   2 years ago Encounter for general adult medical examination with abnormal findings   [place] [facility] [place], Salvadore Oxford, NP       Future Appointments             In 2 months Baity, Salvadore Oxford, NP  [facility], [facility]   In 5 months Orlie Dakin, Tollie Pizza, MD [facility] - A Dept Of [place]. [facility]

## 2023-04-09 ENCOUNTER — Other Ambulatory Visit: Payer: Self-pay

## 2023-04-09 MED FILL — Zolpidem Tartrate Tab 5 MG: ORAL | 30 days supply | Qty: 30 | Fill #0 | Status: AC

## 2023-04-09 NOTE — Telephone Encounter (Signed)
Requested medication (s) are due for refill today:   Provider to review  Requested medication (s) are on the active medication list:   Yes  Future visit scheduled:   Yes 4/21   Last ordered: 02/27/2023 #30, 0 refills  Non delegated refill    Requested Prescriptions  Pending Prescriptions Disp Refills   zolpidem (AMBIEN) 5 MG tablet 30 tablet 0    Sig: Take 1 tablet (5 mg total) by mouth at bedtime as needed for sleep.     Not Delegated - Psychiatry:  Anxiolytics/Hypnotics Failed - 04/09/2023  1:11 PM      Failed - This refill cannot be delegated      Failed - Urine Drug Screen completed in last 360 days      Passed - Valid encounter within last 6 months    Recent Outpatient Visits           3 months ago Encounter for general adult medical examination with abnormal findings   [place] [facility] [place], [place] W, NP   10 months ago Constipation due to slow transit   [place] [facility] [place], Salvadore Oxford, NP   1 year ago Encounter for general adult medical examination with abnormal findings   [place] [facility], Salvadore Oxford, NP   2 years ago Situational anxiety   [place] [facility] [place], Salvadore Oxford, NP   2 years ago Encounter for general adult medical examination with abnormal findings   [place] [facility] [place], Salvadore Oxford, NP       Future Appointments             In 2 months Baity, Salvadore Oxford, NP [place] [facility], [facility]   In 5 months Orlie Dakin, Tollie Pizza, MD [facility] - A Dept Of [place]. [facility]

## 2023-04-10 ENCOUNTER — Other Ambulatory Visit: Payer: Self-pay

## 2023-05-08 ENCOUNTER — Other Ambulatory Visit: Payer: Self-pay | Admitting: Internal Medicine

## 2023-05-08 ENCOUNTER — Other Ambulatory Visit: Payer: Self-pay

## 2023-05-08 NOTE — Telephone Encounter (Signed)
 Requested medication (s) are due for refill today: yes  Requested medication (s) are on the active medication list: yes  Last refill:  04/09/23 #30/0  Future visit scheduled: yes  Notes to clinic:  Unable to refill per protocol, cannot delegate.     Requested Prescriptions  Pending Prescriptions Disp Refills   zolpidem (AMBIEN) 5 MG tablet 30 tablet 0    Sig: Take 1 tablet (5 mg total) by mouth at bedtime as needed for sleep.     There is no refill protocol information for this order

## 2023-05-11 ENCOUNTER — Other Ambulatory Visit: Payer: Self-pay

## 2023-05-11 MED FILL — Zolpidem Tartrate Tab 5 MG: ORAL | 30 days supply | Qty: 30 | Fill #0 | Status: AC

## 2023-06-10 ENCOUNTER — Other Ambulatory Visit: Payer: Self-pay

## 2023-06-10 ENCOUNTER — Other Ambulatory Visit: Payer: Self-pay | Admitting: Internal Medicine

## 2023-06-11 ENCOUNTER — Other Ambulatory Visit: Payer: Self-pay

## 2023-06-11 ENCOUNTER — Other Ambulatory Visit: Payer: Self-pay | Admitting: Internal Medicine

## 2023-06-11 NOTE — Telephone Encounter (Signed)
 Requested medications are due for refill today.  yes  Requested medications are on the active medications list.  yes  Last refill. 05/11/2023 #30 0 rf  Future visit scheduled.   yes  Notes to clinic.  Refill not delegated.    Requested Prescriptions  Pending Prescriptions Disp Refills   zolpidem (AMBIEN) 5 MG tablet 30 tablet 0    Sig: Take 1 tablet (5 mg total) by mouth at bedtime as needed for sleep.     Not Delegated - Psychiatry:  Anxiolytics/Hypnotics Failed - 06/11/2023 10:03 AM      Failed - This refill cannot be delegated      Failed - Urine Drug Screen completed in last 360 days      Failed - Valid encounter within last 6 months    Recent Outpatient Visits   None     Future Appointments             In 1 week Baity, Rankin Buzzard, NP [facility], [facility]   In 3 months Adrian Alba, Deadra Everts, MD [facility] - [facility] Dept Of [place]. [facility]

## 2023-06-12 ENCOUNTER — Other Ambulatory Visit: Payer: Self-pay

## 2023-06-12 MED FILL — Zolpidem Tartrate Tab 5 MG: ORAL | 30 days supply | Qty: 30 | Fill #0 | Status: AC

## 2023-06-12 NOTE — Telephone Encounter (Signed)
 Requested medication (s) are due for refill today - yes  Requested medication (s) are on the active medication list -yes  Future visit scheduled -yes  Last refill: 05/11/23 #30  Notes to clinic: non delegated Rx  Requested Prescriptions  Pending Prescriptions Disp Refills   zolpidem  (AMBIEN ) 5 MG tablet 30 tablet 0    Sig: Take 1 tablet (5 mg total) by mouth at bedtime as needed for sleep.     Not Delegated - Psychiatry:  Anxiolytics/Hypnotics Failed - 06/12/2023  8:32 AM      Failed - This refill cannot be delegated      Failed - Urine Drug Screen completed in last 360 days      Failed - Valid encounter within last 6 months    Recent Outpatient Visits   None     Future Appointments             In 6 days Baity, Rankin Buzzard, NP [place] [facility], [facility]   In 3 months Adrian Alba, Deadra Everts, MD [facility] - [facility] Dept Of [place]. [facility]               Requested Prescriptions  Pending Prescriptions Disp Refills   zolpidem  (AMBIEN ) 5 MG tablet 30 tablet 0    Sig: Take 1 tablet (5 mg total) by mouth at bedtime as needed for sleep.     Not Delegated - Psychiatry:  Anxiolytics/Hypnotics Failed - 06/12/2023  8:32 AM      Failed - This refill cannot be delegated      Failed - Urine Drug Screen completed in last 360 days      Failed - Valid encounter within last 6 months    Recent Outpatient Visits   None     Future Appointments             In 6 days Baity, Rankin Buzzard, NP [place] [facility], [facility]   In 3 months Adrian Alba, Deadra Everts, MD [facility] - [facility] Dept Of [place]. [facility]

## 2023-06-15 ENCOUNTER — Other Ambulatory Visit: Payer: Self-pay

## 2023-06-15 ENCOUNTER — Ambulatory Visit: Payer: Self-pay | Admitting: Internal Medicine

## 2023-06-18 ENCOUNTER — Ambulatory Visit: Admitting: Internal Medicine

## 2023-06-18 ENCOUNTER — Encounter: Payer: Self-pay | Admitting: Internal Medicine

## 2023-06-18 VITALS — BP 124/68 | Ht 65.5 in | Wt 153.2 lb

## 2023-06-18 DIAGNOSIS — F5104 Psychophysiologic insomnia: Secondary | ICD-10-CM

## 2023-06-18 DIAGNOSIS — K219 Gastro-esophageal reflux disease without esophagitis: Secondary | ICD-10-CM | POA: Diagnosis not present

## 2023-06-18 DIAGNOSIS — E7800 Pure hypercholesterolemia, unspecified: Secondary | ICD-10-CM

## 2023-06-18 DIAGNOSIS — C50511 Malignant neoplasm of lower-outer quadrant of right female breast: Secondary | ICD-10-CM

## 2023-06-18 DIAGNOSIS — F418 Other specified anxiety disorders: Secondary | ICD-10-CM

## 2023-06-18 DIAGNOSIS — M816 Localized osteoporosis [Lequesne]: Secondary | ICD-10-CM

## 2023-06-18 DIAGNOSIS — K5901 Slow transit constipation: Secondary | ICD-10-CM | POA: Diagnosis not present

## 2023-06-18 DIAGNOSIS — M810 Age-related osteoporosis without current pathological fracture: Secondary | ICD-10-CM | POA: Insufficient documentation

## 2023-06-18 NOTE — Progress Notes (Signed)
 Subjective:    Patient ID: Heather Jensen, female    DOB: [birth year], 63 y.o.   MRN: [id number]  HPI  Patient presents to clinic today for 84-month follow-up of chronic conditions.  Constipation: Managed with diet but she reports this has improved.  She is not currently taking any medications for this.  Colonoscopy from 01/2023 reviewed.  GERD: Triggered by eating too late at night.  She is not taking any medications for this but has been prescribed omeprazole in the past.  There is no upper GI on file.  Insomnia: She has difficulty falling and staying asleep.  She takes ambien  as prescribed.  There is no sleep study on file.  History of breast cancer: In remission status post.  She is taking letrozole  as prescribed.  She gets yearly mammograms.  She follows with oncology.  HLD: Her last LDL was 111, triglycerides 69, 11/2022.  She is not taking any cholesterol-lowering medication at this time.  She tries to consume low-fat diet.  Anxiety: Situational.  She is not currently taking any medications for this.  She is not currently seeing a therapist.  She denies depression, SI/HI.  Osteoporosis: She is taking calcium or vitamin D  OTC.  She does get some weightbearing exercise.  Bone density from 08/2022 reviewed.  Review of Systems     Past Medical History:  Diagnosis Date   Adenomatous polyp 2019   Anxiety    Breast cancer (HCC) 03/22/2021   Chicken pox    Depression    Genetic testing 07/09/2021   Negative genetic testing. No pathogenic variants identified on the [facility] CancerNext-Expanded+RNA panel. The report date is 07/08/2021.     The CancerNext-Expanded + RNAinsight gene panel offered by [employer] and includes sequencing and rearrangement analysis for the following 77 genes: IP, ALK, APC*, ATM*, AXIN2, BAP1, BARD1, BLM, BMPR1A, BRCA1*, BRCA2*, BRIP1*, CDC73, CDH1*,CDK4, CDKN1B, CDKN   GERD (gastroesophageal reflux disease)    Personal history of radiation therapy      Current Outpatient Medications  Medication Sig Dispense Refill   Calcium-Magnesium-Vitamin D  (CALCIUM 1200+D3 PO) Take by mouth.     letrozole  (FEMARA ) 2.5 MG tablet Take 1 tablet (2.5 mg total) by mouth daily. 90 tablet 3   zolpidem  (AMBIEN ) 5 MG tablet Take 1 tablet (5 mg total) by mouth at bedtime as needed for sleep. 30 tablet 0   No current facility-administered medications for this visit.    No Known Allergies  Family History  Problem Relation Age of Onset   Hyperlipidemia Mother    Alzheimer's disease Mother    Hypertension Father    Heart disease Father    Melanoma Brother 75   Lung cancer Paternal Grandfather    Breast cancer Neg Hx    Stroke Neg Hx     Social History   Socioeconomic History   Marital status: Legally Separated    Spouse name: Not on file   Number of children: 1   Years of education: Not on file   Highest education level: Bachelor's degree (e.g., BA, AB, BS)  Occupational History   Not on file  Tobacco Use   Smoking status: Former    Current packs/day: 0.00    Average packs/day: 0.5 packs/day for 30.0 years (15.0 ttl pk-yrs)    Types: Cigarettes    Start date: 04/10/1984    Quit date: 04/10/2014    Years since quitting: 9.1   Smokeless tobacco: Never  Vaping Use   Vaping status: Never Used  Substance and Sexual Activity   Alcohol use: Not Currently    Alcohol/week: 1.0 standard drink of alcohol    Types: 1 Glasses of wine per week    Comment: on special occassions   Drug use: No   Sexual activity: Yes    Partners: Male    Birth control/protection: None  Other Topics Concern   Not on file  Social History Narrative   Not on file   Social Drivers of Health   Financial Resource Strain: Low Risk  (04/10/2017)   Overall Financial Resource Strain (CARDIA)    Difficulty of Paying Living Expenses: Not hard at all  Food Insecurity: No Food Insecurity (04/10/2017)   Hunger Vital Sign    Worried About Running Out of Food in the Last  Year: Never true    Ran Out of Food in the Last Year: Never true  Transportation Needs: No Transportation Needs (04/10/2017)   PRAPARE - Administrator, Civil Service (Medical): No    Lack of Transportation (Non-Medical): No  Physical Activity: Sufficiently Active (04/10/2017)   Exercise Vital Sign    Days of Exercise per Week: 6 days    Minutes of Exercise per Session: 40 min  Stress: No Stress Concern Present (04/10/2017)   [employer] of Occupational Health - Occupational Stress Questionnaire    Feeling of Stress : Only a little  Social Connections: Unknown (04/10/2017)   Social Connection and Isolation Panel [NHANES]    Frequency of Communication with Friends and Family: Patient declined    Frequency of Social Gatherings with Friends and Family: Patient declined    Attends Religious Services: Patient declined    Database administrator or Organizations: Patient declined    Attends Banker Meetings: Patient declined    Marital Status: Patient declined  Intimate Partner Violence: Not At Risk (04/10/2017)   Humiliation, Afraid, Rape, and Kick questionnaire    Fear of Current or Ex-Partner: No    Emotionally Abused: No    Physically Abused: No    Sexually Abused: No     Constitutional: Denies fever, malaise, fatigue, headache or abrupt weight changes.  HEENT: Denies eye pain, eye redness, ear pain, ringing in the ears, wax buildup, runny nose, nasal congestion, bloody nose, or sore throat. Respiratory: Denies difficulty breathing, shortness of breath, cough or sputum production.   Cardiovascular: Denies chest pain, chest tightness, palpitations or swelling in the hands or feet.  Gastrointestinal: Patient reports intermittent constipation.  Denies abdominal pain, bloating, diarrhea or blood in the stool.  GU: Denies urgency, frequency, dysuria, burning sensation, blood in urine, odor or discharge. Musculoskeletal: Denies decrease in range of motion,  difficulty with gait, muscle pain or joint pain and swelling.  Skin: Denies redness, rashes, lesions or ulcercations.  Neurological: Patient reports insomnia.  Denies dizziness, difficulty with memory, difficulty with speech or problems with balance and coordination.  Psych: Patient has a history of anxiety.  Denies depression, SI/HI.  No other specific complaints in a complete review of systems (except as listed in HPI above).  Objective:   Physical Exam  BP 124/68 (BP Location: Left Arm, Patient Position: Sitting, Cuff Size: Normal)   Ht 5' 5.5" (1.664 m)   Wt 153 lb 3.2 oz (69.5 kg)   BMI 25.11 kg/m    Wt Readings from Last 3 Encounters:  03/16/23 155 lb 14.4 oz (70.7 kg)  02/02/23 154 lb (69.9 kg)  12/12/22 156 lb (70.8 kg)    General: Appears her  stated age, well developed, well nourished in NAD. Skin: Warm, dry and intact.  HEENT: Head: normal shape and size; Eyes: sclera white, no icterus, conjunctiva pink, PERRLA and EOMs intact;  Cardiovascular: Normal rate and rhythm. S1,S2 noted.  No murmur, rubs or gallops noted. No JVD or BLE edema. No carotid bruits noted. Pulmonary/Chest: Normal effort and positive vesicular breath sounds. No respiratory distress. No wheezes, rales or ronchi noted.  Musculoskeletal: No difficulty with gait.  Neurological: Alert and oriented. Coordination normal.  Psychiatric: Mood and affect normal. Behavior is normal. Judgment and thought content normal.    BMET    Component Value Date/Time   NA 139 12/12/2022 0841   K 4.6 12/12/2022 0841   CL 103 12/12/2022 0841   CO2 28 12/12/2022 0841   GLUCOSE 98 12/12/2022 0841   BUN 11 12/12/2022 0841   CREATININE 0.75 12/12/2022 0841   CALCIUM 9.7 12/12/2022 0841   GFRNONAA 78 11/04/2019 0908   GFRAA 90 11/04/2019 0908    Lipid Panel     Component Value Date/Time   CHOL 197 12/12/2022 0841   TRIG 69 12/12/2022 0841   HDL 70 12/12/2022 0841   CHOLHDL 2.8 12/12/2022 0841   VLDL 20.8  10/17/2015 1412   LDLCALC 111 (H) 12/12/2022 0841    CBC    Component Value Date/Time   WBC 4.1 12/12/2022 0841   RBC 4.69 12/12/2022 0841   HGB 14.1 12/12/2022 0841   HCT 42.8 12/12/2022 0841   PLT 277 12/12/2022 0841   MCV 91.3 12/12/2022 0841   MCH 30.1 12/12/2022 0841   MCHC 32.9 12/12/2022 0841   RDW 12.3 12/12/2022 0841   LYMPHSABS 1,786 11/04/2019 0908   EOSABS 130 11/04/2019 0908   BASOSABS 19 11/04/2019 0908    Hgb A1C Lab Results  Component Value Date   HGBA1C 5.1 12/12/2022           Assessment & Plan:     RTC in 6 months for your annual exam Helayne Lo, NP

## 2023-06-18 NOTE — Assessment & Plan Note (Signed)
 Will check lipid profile with annual exam Encouraged her to consume a low-fat diet

## 2023-06-18 NOTE — Assessment & Plan Note (Signed)
Encouraged high fiber diet and adequate water intake 

## 2023-06-18 NOTE — Assessment & Plan Note (Signed)
Continue ambien as needed.

## 2023-06-18 NOTE — Patient Instructions (Signed)
 Weak, Fragile Bones (Osteoporosis): What to Know  Osteoporosis is when the bones become thin and less dense than normal. Osteoporosis makes bones more fragile and more likely to break. Over time, the condition can cause your bones to become so weak that they break, or fracture, after a minor fall. Bones in the hip, wrist, and spine are most likely to break. What are the causes? The exact cause of this condition is not known. What increases the risk? Having family members with this condition. Taking: Steroid medicines. Anti-seizure medicines. Being female. Being 62 years old or older. Being of [place] decent. Smoking or using other products that contain nicotine or tobacco. Not exercising. What are the signs or symptoms? A broken bone might be the first sign, especially if the break results from a fall or injury that usually would not cause a bone to break. Other signs and symptoms include: Pain in the neck or low back. Being hunched over. Getting shorter. How is this diagnosed? This condition may be diagnosed based on: Your medical history. A physical exam. A bone mineral density test, also called a DXA or DEXA test. This test uses X-rays to measure how dense your bones are. How is this treated? This condition may be treated with medicines and supplements, including: Taking medicines to slow bone loss or help make the bones stronger. Taking calcium and vitamin D supplements every day. Taking hormone replacement medicines, such as estrogen for female and testosterone for males. It may also be treated by: Quitting smoking or using tobacco products. Doing exercises. Limiting how much alcohol you drink. Eating more foods with calcium and vitamin D in them. Monitoring your blood levels of calcium and vitamin D. The goal of treatment is to strengthen your bones and lower your risk for a bone break. Follow these instructions at home: Eating and drinking Eat plenty of food with  calcium and vitamin D in them. This may include: Some fish, such as salmon and tuna. Foods that have calcium and vitamin D added to them, such as some cereals. Egg yolks. Cheese. Liver.  Activity Exercise as told. Ask what things are safe for you to do. You may be told to: Do exercises that make your muscles work to hold your body weight up, such as tai chi, yoga, or walking. These are called weight-bearing exercises. Do exercises to make your muscles stronger, such as lifting weights. These are called muscle-strengthening exercises. Lifestyle Do not drink alcohol if your health care provider tells you not to drink. Your health care provider tells you not to drink. You are pregnant, may be pregnant, or are planning to become pregnant. If you drink alcohol: Limit how much you have to: 0-1 drink a day if you're female. 0-2 drinks a day if you're female. Know how much alcohol is in your drink. In the [place] one drink is one 12 oz bottle of beer (355 mL), one 5 oz glass of wine (148 mL), or one 1 oz glass of hard liquor (44 mL). Do not smoke, vape, or use nicotine or tobacco. Preventing falls Use a cane, walker, scooter, or crutches to help you move around if needed. Keep rooms well-lit and get rid of clutter. Put away things on the floor that could make you trip, such as cords and rugs. Put grab bars in bathrooms and safety rails on stairs. Use rubber mats in slippery areas, like bathrooms. Wear shoes that: Fit you well. Support your feet. Have closed toes. Have rubber soles or low  heels. Talk to your provider about all of the medicines you take. Some medicines can make you more likely to fall because they can cause dizziness or changes in blood pressure. General instructions Take your medicines only as told. Keep all follow-up visits. Your provider may want to repeat tests. Where to find more information Bone Health and Osteoporosis Foundation: bonehealthandosteoporosis.org Contact  a health care provider if: You have never been screened for osteoporosis and you are: A female who is 16 years old or older. A female who is age 28 years old or older. Get help right away if: You fall. You get hurt. This information is not intended to replace advice given to you by your health care provider. Make sure you discuss any questions you have with your health care provider. Document Revised: 12/23/2022 Document Reviewed: 08/29/2022 Elsevier Patient Education  2024 [employer].

## 2023-06-18 NOTE — Assessment & Plan Note (Signed)
Not medicated Support offered 

## 2023-06-18 NOTE — Assessment & Plan Note (Signed)
 Currently not an issue Avoid foods that trigger reflux

## 2023-06-18 NOTE — Assessment & Plan Note (Signed)
Continue yearly mammograms Continue letrozole

## 2023-06-18 NOTE — Assessment & Plan Note (Signed)
 Encouraged daily weight bearing exercise Continue calcium and vit d otc She has been discussing starting fosamax with oncology

## 2023-07-08 ENCOUNTER — Encounter: Payer: Self-pay | Admitting: Radiation Oncology

## 2023-07-08 ENCOUNTER — Ambulatory Visit
Admission: RE | Admit: 2023-07-08 | Discharge: 2023-07-08 | Disposition: A | Discharge status: Home / Self Care | Payer: 59 | Source: Ambulatory Visit | Attending: Radiation Oncology | Admitting: Radiation Oncology

## 2023-07-08 VITALS — BP 109/74 | HR 60 | Resp 14 | Ht 66.0 in | Wt 152.2 lb

## 2023-07-08 DIAGNOSIS — Z170 Estrogen receptor positive status [ER+]: Secondary | ICD-10-CM | POA: Insufficient documentation

## 2023-07-08 DIAGNOSIS — Z79811 Long term (current) use of aromatase inhibitors: Secondary | ICD-10-CM | POA: Diagnosis not present

## 2023-07-08 DIAGNOSIS — Z923 Personal history of irradiation: Secondary | ICD-10-CM | POA: Diagnosis not present

## 2023-07-08 DIAGNOSIS — C50511 Malignant neoplasm of lower-outer quadrant of right female breast: Secondary | ICD-10-CM | POA: Diagnosis not present

## 2023-07-08 NOTE — Progress Notes (Signed)
 Radiation Oncology Follow up Note  Name: Heather Jensen   Date:   07/08/2023 MRN:  [id number] DOB: [birth year]    This 63 y.o. female presents to the clinic today for 2-year follow-up status post whole breast radiation to right breast for stage Ia ER positive invasive mammary carcinoma.  REFERRING PROVIDER: Carollynn Cirri, NP  HPI: Patient is a 63 year old female now seen out 2 years having completed whole breast radiation to her right breast for stage Ia ER positive invasive mammary carcinoma seen today in routine follow-up she is doing well.  She specifically [place] cough bone pain or any breast tenderness.  She is currently on Femara  tolerating it well without side effect..  She had mammograms back in January which I have reviewed were BI-RADS 2 benign.  COMPLICATIONS OF TREATMENT: none  FOLLOW UP COMPLIANCE: keeps appointments   PHYSICAL EXAM:  There were no vitals taken for this visit. Lungs are clear to A&P cardiac examination essentially unremarkable with regular rate and rhythm. No dominant mass or nodularity is noted in either breast in 2 positions examined. Incision is well-healed. No axillary or supraclavicular adenopathy is appreciated. Cosmetic result is excellent.  Well-developed well-nourished patient in NAD. HEENT reveals PERLA, EOMI, discs not visualized.  Oral cavity is clear. No oral mucosal lesions are identified. Neck is clear without evidence of cervical or supraclavicular adenopathy. Lungs are clear to A&P. Cardiac examination is essentially unremarkable with regular rate and rhythm without murmur rub or thrill. Abdomen is benign with no organomegaly or masses noted. Motor sensory and DTR levels are equal and symmetric in the upper and lower extremities. Cranial nerves II through XII are grossly intact. Proprioception is intact. No peripheral adenopathy or edema is identified. No motor or sensory levels are noted. Crude visual fields are within normal range.  RADIOLOGY  RESULTS: Mammograms reviewed compatible with above-stated findings  PLAN: 1 time patient is doing well with no evidence of disease now out over 2 years from whole breast radiation and pleased with her overall progress.  I will turn follow-up care over to medical oncology.  I be happy to reevaluate her at any time should that be indicated.  She continues on Femara  without side effect.  I would like to take this opportunity to thank you for allowing me to participate in the care of your patient.Glenis Langdon, MD

## 2023-07-15 ENCOUNTER — Other Ambulatory Visit: Payer: Self-pay | Admitting: Internal Medicine

## 2023-07-17 ENCOUNTER — Other Ambulatory Visit: Payer: Self-pay

## 2023-07-17 MED ORDER — ZOLPIDEM TARTRATE 5 MG PO TABS
5.0000 mg | ORAL_TABLET | Freq: Every evening | ORAL | 0 refills | Status: DC | PRN
  Filled 2023-07-17: qty 30, 30d supply, fill #0

## 2023-07-17 NOTE — Telephone Encounter (Signed)
 Requested medication (s) are due for refill today: yes  Requested medication (s) are on the active medication list: yes  Last refill:  06/12/23 #30 tab  Future visit scheduled: yes  Notes to clinic:  med not delegated to NT to RF   Requested Prescriptions  Pending Prescriptions Disp Refills   zolpidem  (AMBIEN ) 5 MG tablet 30 tablet 0    Sig: Take 1 tablet (5 mg total) by mouth at bedtime as needed for sleep.     Not Delegated - Psychiatry:  Anxiolytics/Hypnotics Failed - 07/17/2023  8:55 AM      Failed - This refill cannot be delegated      Failed - Urine Drug Screen completed in last 360 days      Passed - Valid encounter within last 6 months    Recent Outpatient Visits           4 weeks ago Constipation due to slow transit   [facility] [place], Rankin Buzzard, NP       Future Appointments             In 2 months Adrian Alba, Deadra Everts, MD [facility] - A Dept Of [place]. [facility]

## 2023-08-15 ENCOUNTER — Other Ambulatory Visit: Payer: Self-pay | Admitting: Internal Medicine

## 2023-08-16 ENCOUNTER — Other Ambulatory Visit: Payer: Self-pay | Admitting: Internal Medicine

## 2023-08-16 ENCOUNTER — Other Ambulatory Visit: Payer: Self-pay

## 2023-08-17 ENCOUNTER — Other Ambulatory Visit: Payer: Self-pay

## 2023-08-18 ENCOUNTER — Encounter: Payer: Self-pay | Admitting: Internal Medicine

## 2023-08-18 ENCOUNTER — Other Ambulatory Visit: Payer: Self-pay

## 2023-08-18 MED ORDER — ZOLPIDEM TARTRATE 5 MG PO TABS
5.0000 mg | ORAL_TABLET | Freq: Every evening | ORAL | 0 refills | Status: DC | PRN
  Filled 2023-08-18: qty 30, 30d supply, fill #0

## 2023-09-07 ENCOUNTER — Ambulatory Visit
Admission: RE | Admit: 2023-09-07 | Discharge: 2023-09-07 | Disposition: A | Discharge status: Home / Self Care | Source: Ambulatory Visit | Attending: Oncology | Admitting: Oncology

## 2023-09-07 DIAGNOSIS — Z79811 Long term (current) use of aromatase inhibitors: Secondary | ICD-10-CM | POA: Insufficient documentation

## 2023-09-07 DIAGNOSIS — C50511 Malignant neoplasm of lower-outer quadrant of right female breast: Secondary | ICD-10-CM | POA: Diagnosis not present

## 2023-09-07 DIAGNOSIS — M810 Age-related osteoporosis without current pathological fracture: Secondary | ICD-10-CM | POA: Diagnosis not present

## 2023-09-07 DIAGNOSIS — Z780 Asymptomatic menopausal state: Secondary | ICD-10-CM | POA: Diagnosis not present

## 2023-09-16 ENCOUNTER — Other Ambulatory Visit: Payer: Self-pay | Admitting: Internal Medicine

## 2023-09-16 ENCOUNTER — Other Ambulatory Visit: Payer: Self-pay

## 2023-09-17 ENCOUNTER — Other Ambulatory Visit: Payer: Self-pay | Admitting: Internal Medicine

## 2023-09-17 ENCOUNTER — Other Ambulatory Visit: Payer: Self-pay

## 2023-09-18 ENCOUNTER — Other Ambulatory Visit: Payer: Self-pay

## 2023-09-18 NOTE — Telephone Encounter (Signed)
 Requested medications are due for refill today.  yes  Requested medications are on the active medications list.  yes  Last refill. 08/18/2023 #30 0 rf  Future visit scheduled.   yes  Notes to clinic.  Refill not delegated.    Requested Prescriptions  Pending Prescriptions Disp Refills   zolpidem  (AMBIEN ) 5 MG tablet 30 tablet 0    Sig: Take 1 tablet (5 mg total) by mouth at bedtime as needed for sleep.     Not Delegated - Psychiatry:  Anxiolytics/Hypnotics Failed - 09/18/2023  3:47 PM      Failed - This refill cannot be delegated      Failed - Urine Drug Screen completed in last 360 days      Passed - Valid encounter within last 6 months    Recent Outpatient Visits           3 months ago Constipation due to slow transit   [facility], Angeline ORN, NP       Future Appointments             In 4 days Jacobo, Evalene PARAS, MD [facility] - A Dept Of [place]. [facility]

## 2023-09-21 ENCOUNTER — Other Ambulatory Visit: Payer: Self-pay

## 2023-09-21 MED FILL — Zolpidem Tartrate Tab 5 MG: ORAL | 30 days supply | Qty: 30 | Fill #0 | Status: AC

## 2023-09-22 ENCOUNTER — Inpatient Hospital Stay: Payer: Commercial Managed Care - PPO | Attending: Oncology | Admitting: Oncology

## 2023-09-22 DIAGNOSIS — C50511 Malignant neoplasm of lower-outer quadrant of right female breast: Secondary | ICD-10-CM

## 2023-09-22 NOTE — Progress Notes (Unsigned)
 [facility]  [phone] [phone]  ID: Heather Gretta Hasten OB: 12-28-1960  MR#: [id number]  RDW#:[id number]  Patient Care Team: Antonette Angeline ORN, NP as PCP - General (Internal Medicine) Jacobo Evalene PARAS, MD as Consulting Physician (Oncology) Lenn Aran, MD as Consulting Physician (Radiation Oncology)  I connected with Heather Jensen on 09/23/23 at  3:30 PM EDT by video enabled telemedicine visit and verified that I am speaking with the correct person using two identifiers.   I discussed the limitations, risks, security and privacy concerns of performing an evaluation and management service by telemedicine and the availability of in-person appointments. I also discussed with the patient that there may be a patient responsible charge related to this service. The patient expressed understanding and agreed to proceed.   Other persons participating in the visit and their role in the encounter: Patient, MD.  Patient's location: Home. Provider's location: Clinic.  CHIEF COMPLAINT: Pathologic stage Ia ER/PR positive, HER-2 negative invasive carcinoma of the lower outer quadrant of the right breast.  Oncotype score 13, low risk.  INTERVAL HISTORY: Patient agreed to video-assisted telemedicine visit for routine 81-month evaluation.  She continues to tolerate letrozole  without significant side effects.  She currently feels well and is asymptomatic.  She has no neurologic complaints.  She denies any recent fevers or illnesses.  She has a good appetite and denies weight loss.  She has no chest pain, shortness of breath, cough, or hemoptysis.  She denies any nausea, vomiting, constipation, or diarrhea.  She has no urinary complaints.  Patient offers no specific complaints today.  REVIEW OF SYSTEMS:   Review of Systems  Constitutional: Negative.  Negative for fever, malaise/fatigue and weight loss.  Respiratory: Negative.  Negative for cough,  hemoptysis and shortness of breath.   Cardiovascular: Negative.  Negative for chest pain and leg swelling.  Gastrointestinal: Negative.  Negative for abdominal pain.  Genitourinary: Negative.  Negative for dysuria.  Musculoskeletal: Negative.  Negative for back pain.  Skin: Negative.  Negative for rash.  Neurological: Negative.  Negative for dizziness, focal weakness, weakness and headaches.  Psychiatric/Behavioral: Negative.  The patient is not nervous/anxious.     As per HPI. Otherwise, a complete review of systems is negative.  PAST MEDICAL HISTORY: Past Medical History:  Diagnosis Date   Adenomatous polyp 2019   Anxiety    Breast cancer (HCC) 03/22/2021   Chicken pox    Depression    Genetic testing 07/09/2021   Negative genetic testing. No pathogenic variants identified on the [facility] CancerNext-Expanded+RNA panel. The report date is 07/08/2021.     The CancerNext-Expanded + RNAinsight gene panel offered by [employer] and includes sequencing and rearrangement analysis for the following 77 genes: IP, ALK, APC*, ATM*, AXIN2, BAP1, BARD1, BLM, BMPR1A, BRCA1*, BRCA2*, BRIP1*, CDC73, CDH1*,CDK4, CDKN1B, CDKN   GERD (gastroesophageal reflux disease)    Personal history of radiation therapy     PAST SURGICAL HISTORY: Past Surgical History:  Procedure Laterality Date   BREAST BIOPSY Left 2010   biopsy of a lump   BREAST LUMPECTOMY     BREAST LUMPECTOMY WITH SENTINEL LYMPH NODE BIOPSY Right 04/10/2021   Procedure: BREAST LUMPECTOMY WITH SENTINEL LYMPH NODE BX;  Surgeon: Dessa Reyes ORN, MD;  Location: [facility] ORS;  Service: General;  Laterality: Right;   CESAREAN SECTION  1989   COLONOSCOPY  2019   Adenomatous Polyp: CBF 12/2017: Recall ltr mailed 12/11/17   COLONOSCOPY WITH PROPOFOL  N/A 02/02/2023   Procedure: COLONOSCOPY WITH  PROPOFOL ;  Surgeon: Jinny Carmine, MD;  Location: [facility];  Service: Endoscopy;  Laterality: N/A;   POLYPECTOMY  02/02/2023   Procedure:  POLYPECTOMY;  Surgeon: Jinny Carmine, MD;  Location: [facility];  Service: Endoscopy;;   TUBAL LIGATION  2000    FAMILY HISTORY: Family History  Problem Relation Age of Onset   Hyperlipidemia Mother    Alzheimer's disease Mother    Hypertension Father    Heart disease Father    Melanoma Brother 26   Lung cancer Paternal Grandfather    Breast cancer Neg Hx    Stroke Neg Hx     ADVANCED DIRECTIVES (Y/N):  N  HEALTH MAINTENANCE: Social History   Tobacco Use   Smoking status: Former    Current packs/day: 0.00    Average packs/day: 0.5 packs/day for 30.0 years (15.0 ttl pk-yrs)    Types: Cigarettes    Start date: 04/10/1984    Quit date: 04/10/2014    Years since quitting: 9.4   Smokeless tobacco: Never  Vaping Use   Vaping status: Never Used  Substance Use Topics   Alcohol use: Not Currently    Alcohol/week: 1.0 standard drink of alcohol    Types: 1 Glasses of wine per week    Comment: on special occassions   Drug use: No     Colonoscopy:  PAP:  Bone density:  Lipid panel:  No Known Allergies  Current Outpatient Medications  Medication Sig Dispense Refill   Calcium-Magnesium-Vitamin D  (CALCIUM 1200+D3 PO) Take by mouth.     letrozole  (FEMARA ) 2.5 MG tablet Take 1 tablet (2.5 mg total) by mouth daily. 90 tablet 3   zolpidem  (AMBIEN ) 5 MG tablet Take 1 tablet (5 mg total) by mouth at bedtime as needed for sleep. 30 tablet 0   No current facility-administered medications for this visit.    OBJECTIVE: There were no vitals filed for this visit.    There is no height or weight on file to calculate BMI.    ECOG FS:0 - Asymptomatic  General: Well-developed, well-nourished, no acute distress. HEENT: Normocephalic. Neuro: Alert, answering all questions appropriately. Cranial nerves grossly intact. Psych: Normal affect.  LAB RESULTS:  Lab Results  Component Value Date   NA 139 12/12/2022   K 4.6 12/12/2022   CL 103 12/12/2022   CO2 28 12/12/2022    GLUCOSE 98 12/12/2022   BUN 11 12/12/2022   CREATININE 0.75 12/12/2022   CALCIUM 9.7 12/12/2022   PROT 6.9 12/12/2022   ALBUMIN 4.3 10/17/2015   AST 13 12/12/2022   ALT 7 12/12/2022   ALKPHOS 38 (L) 10/17/2015   BILITOT 0.9 12/12/2022   GFRNONAA 78 11/04/2019   GFRAA 90 11/04/2019    Lab Results  Component Value Date   WBC 4.1 12/12/2022   NEUTROABS 3,633 11/04/2019   HGB 14.1 12/12/2022   HCT 42.8 12/12/2022   MCV 91.3 12/12/2022   PLT 277 12/12/2022     STUDIES: DG Bone Density Result Date: 09/07/2023 EXAM: DUAL X-RAY ABSORPTIOMETRY (DXA) FOR BONE MINERAL DENSITY 09/07/2023 1:24 pm CLINICAL DATA:  63 year old Female Postmenopausal. Postmenopausal; Screening for osteoporosis TECHNIQUE: An axial (e.g., hips, spine) and/or appendicular (e.g., radius) exam was performed, as appropriate, using GE Secretary/administrator at [facility]. Images are obtained for bone mineral density measurement and are not obtained for diagnostic purposes. [id number] Exclusions: None. COMPARISON:  09/03/2022 FINDINGS: Scan quality: Good. LUMBAR SPINE (L1-L4): BMD (in g/cm2): 0.932 T-score: -2.1 Z-score: -0.7 Rate of  change from previous exam: No significant rate of change from previous exam. LEFT FEMORAL NECK: BMD (in g/cm2): 0.713 T-score: -2.3 Z-score: -1.0 LEFT TOTAL HIP: BMD (in g/cm2): 0.709 T-score: -2.4 Z-score: -1.3 RIGHT FEMORAL NECK: BMD (in g/cm2): 0.686 T-score: -2.5 Z-score: -1.2 RIGHT TOTAL HIP: BMD (in g/cm2): 0.675 T-score: -2.6 Z-score: -1.6 Rate of change from previous exam: No significant rate of change from previous exam. FRAX 10-YEAR PROBABILITY OF FRACTURE: FRAX not reported as the lowest BMD is not in the osteopenia range. IMPRESSION: Osteoporosis based on BMD. Fracture risk is increased. Increased risk is based on low BMD. RECOMMENDATIONS: 1. All patients should optimize calcium and vitamin D  intake. 2. Consider FDA-approved medical therapies in postmenopausal women and  men aged 28 years and older, based on the following: - A hip or vertebral (clinical or morphometric) fracture - T-score less than or equal to -2.5 and secondary causes have been excluded. - Low bone mass (T-score between -1.0 and -2.5) and a 10-year probability of a hip fracture greater than or equal to 3% or a 10-year probability of a major osteoporosis-related fracture greater than or equal to 20% based on the [place] -adapted WHO algorithm. - Clinician judgment and/or patient preferences may indicate treatment for people with 10-year fracture probabilities above or below these levels 3. Patients with diagnosis of osteoporosis or at high risk for fracture should have regular bone mineral density tests. For patients eligible for Medicare, routine testing is allowed once every 2 years. The testing frequency can be increased to one year for patients who have rapidly progressing disease, those who are receiving or discontinuing medical therapy to restore bone mass, or have additional risk factors. Electronically Signed   By: Reyes Phi M.D.   On: 09/07/2023 15:50    ASSESSMENT: Pathologic stage Ia ER/PR positive, HER-2 negative invasive carcinoma of the lower outer quadrant of the right breast.  Oncotype DX score 13, low risk.  PLAN:    Pathologic stage Ia ER/PR positive, HER-2 negative invasive carcinoma of the lower outer quadrant of the right breast: Patient underwent lumpectomy on April 10, 2021 confirming stage of disease.  Oncotype score was low risk, therefore chemotherapy was not indicated.  Patient completed XRT on June 11, 2021.  Continue letrozole  for total of 5 years completing treatment in April 2028.  Patient's most recent mammogram on March 06, 2023 was reported BI-RADS 2.  Return to clinic in 6 months with repeat mammogram and video-assisted telemedicine visit.      Osteoporosis: Patient's most recent bone mineral density on September 07, 2023 reported a maximum T-score of -2.6 in the right total  hip.  This is essentially unchanged from previous.  Patient once again declined initiating Fosamax but plans to initiate calcium and vitamin D  supplementation.  No further intervention is needed.  Repeat bone mineral density in July 2026.  Follow-up as above.    I provided 20 minutes of face-to-face video visit time during this encounter which included chart review, counseling, and coordination of care as documented above.   Patient expressed understanding and was in agreement with this plan. She also understands that She can call clinic at any time with any questions, concerns, or complaints.    Cancer Staging  Carcinoma of lower outer quadrant of right breast [facility]) Staging form: Breast, AJCC 8th Edition - Clinical stage from 04/01/2021: Stage IA (cT1b, cN0, cM0, G2, ER+, PR+, HER2-) - Signed by Jacobo Evalene PARAS, MD on 04/03/2021 Stage prefix: Initial diagnosis Histologic grading system: 3  grade system   Evalene JINNY Reusing, MD   09/22/2023 2:40 PM

## 2023-10-20 ENCOUNTER — Other Ambulatory Visit: Payer: Self-pay | Admitting: Internal Medicine

## 2023-10-21 ENCOUNTER — Encounter: Payer: Self-pay | Admitting: Internal Medicine

## 2023-10-21 ENCOUNTER — Other Ambulatory Visit: Payer: Self-pay

## 2023-10-22 ENCOUNTER — Other Ambulatory Visit: Payer: Self-pay

## 2023-10-22 MED ORDER — ZOLPIDEM TARTRATE 5 MG PO TABS
5.0000 mg | ORAL_TABLET | Freq: Every evening | ORAL | 0 refills | Status: DC | PRN
  Filled 2023-10-22 (×2): qty 30, 30d supply, fill #0

## 2023-11-16 ENCOUNTER — Other Ambulatory Visit: Payer: Self-pay

## 2023-11-20 ENCOUNTER — Other Ambulatory Visit: Payer: Self-pay | Admitting: Internal Medicine

## 2023-11-20 ENCOUNTER — Other Ambulatory Visit: Payer: Self-pay

## 2023-11-23 ENCOUNTER — Other Ambulatory Visit: Payer: Self-pay

## 2023-11-23 ENCOUNTER — Encounter: Payer: Self-pay | Admitting: Internal Medicine

## 2023-11-23 MED ORDER — ZOLPIDEM TARTRATE 5 MG PO TABS
5.0000 mg | ORAL_TABLET | Freq: Every evening | ORAL | 0 refills | Status: DC | PRN
  Filled 2023-11-23: qty 30, 30d supply, fill #0

## 2023-12-08 ENCOUNTER — Encounter: Payer: Self-pay | Admitting: Internal Medicine

## 2023-12-18 ENCOUNTER — Ambulatory Visit (INDEPENDENT_AMBULATORY_CARE_PROVIDER_SITE_OTHER): Admitting: Internal Medicine

## 2023-12-18 ENCOUNTER — Encounter: Payer: Self-pay | Admitting: Internal Medicine

## 2023-12-18 VITALS — BP 110/72 | Ht 66.0 in | Wt 153.6 lb

## 2023-12-18 DIAGNOSIS — R739 Hyperglycemia, unspecified: Secondary | ICD-10-CM

## 2023-12-18 DIAGNOSIS — E7800 Pure hypercholesterolemia, unspecified: Secondary | ICD-10-CM | POA: Diagnosis not present

## 2023-12-18 DIAGNOSIS — Z0000 Encounter for general adult medical examination without abnormal findings: Secondary | ICD-10-CM | POA: Diagnosis not present

## 2023-12-18 LAB — LIPID PANEL
Cholesterol: 204 mg/dL — ABNORMAL HIGH (ref <200)
HDL: 71 mg/dL (ref >=50)
LDL Cholesterol (Calc): 114 mg/dL — ABNORMAL HIGH
Non-HDL Cholesterol (Calc): 133 mg/dL — ABNORMAL HIGH (ref <130)
Total CHOL/HDL Ratio: 2.9 (calc) (ref <5.0)
Triglycerides: 86 mg/dL (ref <150)

## 2023-12-18 LAB — COMPREHENSIVE METABOLIC PANEL WITH GFR
AG Ratio: 2.1 (calc) (ref 1.0–2.5)
ALT: 9 U/L (ref 6–29)
AST: 15 U/L (ref 10–35)
Albumin: 4.6 g/dL (ref 3.6–5.1)
Alkaline phosphatase (APISO): 42 U/L (ref 37–153)
BUN: 10 mg/dL (ref 7–25)
CO2: 29 mmol/L (ref 20–32)
Calcium: 9.8 mg/dL (ref 8.6–10.4)
Chloride: 101 mmol/L (ref 98–110)
Creat: 0.82 mg/dL (ref 0.50–1.05)
Globulin: 2.2 g/dL (ref 1.9–3.7)
Glucose, Bld: 93 mg/dL (ref 65–99)
Potassium: 4.2 mmol/L (ref 3.5–5.3)
Sodium: 139 mmol/L (ref 135–146)
Total Bilirubin: 1.1 mg/dL (ref 0.2–1.2)
Total Protein: 6.8 g/dL (ref 6.1–8.1)
eGFR: 81 mL/min/1.73m2 (ref >=60)

## 2023-12-18 LAB — CBC
HCT: 42.8 % (ref 35.0–45.0)
Hemoglobin: 14 g/dL (ref 11.7–15.5)
MCH: 30.8 pg (ref 27.0–33.0)
MCHC: 32.7 g/dL (ref 32.0–36.0)
MCV: 94.1 fL (ref 80.0–100.0)
MPV: 10.8 fL (ref 7.5–12.5)
Platelets: 270 Thousand/uL (ref 140–400)
RBC: 4.55 Million/uL (ref 3.80–5.10)
RDW: 12.2 % (ref 11.0–15.0)
WBC: 4.5 Thousand/uL (ref 3.8–10.8)

## 2023-12-18 LAB — HEMOGLOBIN A1C
Hgb A1c MFr Bld: 4.9 % (ref <5.7)
Mean Plasma Glucose: 94 mg/dL
eAG (mmol/L): 5.2 mmol/L

## 2023-12-18 NOTE — Progress Notes (Signed)
 Subjective:    Patient ID: Heather Jensen, female    DOB: 01-24-1961, 63 y.o.   MRN: [id number]  HPI  Patient presents to clinic today for her annual exam.  Flu: 11/2023 Tetanus: Unsure COVID: X 1 Shingrix: 04/2019, 11/2018 Pap smear: 11/2022 Mammogram: 02/2023 Bone density: 08/2023 Colon screening: 01/2023 Vision screening: annually Dentist: biannually  Diet: She does eat meat. She consumes fruits and veggies. She tries to avoid fried foods. She drinks mostly coffee, water . Exercise: Walking   Review of Systems     Past Medical History:  Diagnosis Date   Adenomatous polyp 2019   Anxiety    Breast cancer (HCC) 03/22/2021   Chicken pox    Depression    Genetic testing 07/09/2021   Negative genetic testing. No pathogenic variants identified on the [facility] CancerNext-Expanded+RNA panel. The report date is 07/08/2021.     The CancerNext-Expanded + RNAinsight gene panel offered by [employer] and includes sequencing and rearrangement analysis for the following 77 genes: IP, ALK, APC*, ATM*, AXIN2, BAP1, BARD1, BLM, BMPR1A, BRCA1*, BRCA2*, BRIP1*, CDC73, CDH1*,CDK4, CDKN1B, CDKN   GERD (gastroesophageal reflux disease)    Personal history of radiation therapy     Current Outpatient Medications  Medication Sig Dispense Refill   Calcium-Magnesium-Vitamin D  (CALCIUM 1200+D3 PO) Take by mouth.     letrozole  (FEMARA ) 2.5 MG tablet Take 1 tablet (2.5 mg total) by mouth daily. 90 tablet 3   zolpidem  (AMBIEN ) 5 MG tablet Take 1 tablet (5 mg total) by mouth at bedtime as needed for sleep. 30 tablet 0   No current facility-administered medications for this visit.    No Known Allergies  Family History  Problem Relation Age of Onset   Hyperlipidemia Mother    Alzheimer's disease Mother    Hypertension Father    Heart disease Father    Melanoma Brother 5   Lung cancer Paternal Grandfather    Breast cancer Neg Hx    Stroke Neg Hx     Social History   Socioeconomic  History   Marital status: Legally Separated    Spouse name: Not on file   Number of children: 1   Years of education: Not on file   Highest education level: Associate degree: occupational, Scientist, product/process development, or vocational program  Occupational History   Not on file  Tobacco Use   Smoking status: Former    Current packs/day: 0.00    Average packs/day: 0.5 packs/day for 30.0 years (15.0 ttl pk-yrs)    Types: Cigarettes    Start date: 04/10/1984    Quit date: 04/10/2014    Years since quitting: 9.6   Smokeless tobacco: Never  Vaping Use   Vaping status: Never Used  Substance and Sexual Activity   Alcohol use: Not Currently    Alcohol/week: 1.0 standard drink of alcohol    Types: 1 Glasses of wine per week    Comment: on special occassions   Drug use: No   Sexual activity: Yes    Partners: Male    Birth control/protection: None  Other Topics Concern   Not on file  Social History Narrative   Not on file   Social Drivers of Health   Financial Resource Strain: Low Risk  (12/15/2023)   Overall Financial Resource Strain (CARDIA)    Difficulty of Paying Living Expenses: Not hard at all  Food Insecurity: No Food Insecurity (12/15/2023)   Hunger Vital Sign    Worried About Running Out of Food in the Last Year: Never  true    Ran Out of Food in the Last Year: Never true  Transportation Needs: No Transportation Needs (12/15/2023)   PRAPARE - Administrator, Civil Service (Medical): No    Lack of Transportation (Non-Medical): No  Physical Activity: Insufficiently Active (12/15/2023)   Exercise Vital Sign    Days of Exercise per Week: 4 days    Minutes of Exercise per Session: 30 min  Stress: No Stress Concern Present (12/15/2023)   [employer] of Occupational Health - Occupational Stress Questionnaire    Feeling of Stress: Not at all  Social Connections: Moderately Isolated (12/15/2023)   Social Connection and Isolation Panel    Frequency of Communication with Friends  and Family: More than three times a week    Frequency of Social Gatherings with Friends and Family: Three times a week    Attends Religious Services: 1 to 4 times per year    Active Member of Clubs or Organizations: No    Attends Banker Meetings: Not on file    Marital Status: Separated  Intimate Partner Violence: Not At Risk (04/10/2017)   Humiliation, Afraid, Rape, and Kick questionnaire    Fear of Current or Ex-Partner: No    Emotionally Abused: No    Physically Abused: No    Sexually Abused: No     Constitutional: Denies fever, malaise, fatigue, headache or abrupt weight changes.  HEENT: Denies eye pain, eye redness, ear pain, ringing in the ears, wax buildup, runny nose, nasal congestion, bloody nose, or sore throat. Respiratory: Denies difficulty breathing, shortness of breath, cough or sputum production.   Cardiovascular: Denies chest pain, chest tightness, palpitations or swelling in the hands or feet.  Gastrointestinal: Patient reports reflux, constipation.  Denies abdominal pain, bloating, diarrhea or blood in the stool.  GU: Patient reports vaginal dryness.  Denies urgency, frequency, pain with urination, burning sensation, blood in urine, odor or discharge. Musculoskeletal: Denies decrease in range of motion, difficulty with gait, muscle pain or joint pain and swelling.  Skin: Denies redness, rashes, lesions or ulcercations.  Neurological: Patient reports insomnia.  Denies dizziness, difficulty with memory, difficulty with speech or problems with balance and coordination.  Psych: Patient has a history of anxiety.  Denies depression, SI/HI.  No other specific complaints in a complete review of systems (except as listed in HPI above).  Objective:   Physical Exam BP 110/72 (BP Location: Left Arm, Patient Position: Sitting, Cuff Size: Normal)   Ht 5' 6 (1.676 m)   Wt 153 lb 9.6 oz (69.7 kg)   BMI 24.79 kg/m    Wt Readings from Last 3 Encounters:  07/08/23  152 lb 3.2 oz (69 kg)  06/18/23 153 lb 3.2 oz (69.5 kg)  03/16/23 155 lb 14.4 oz (70.7 kg)    General: Appears her stated age, well developed, well nourished in NAD. Skin: Warm, dry and intact.  HEENT: Head: normal shape and size; Eyes: sclera white, no icterus, conjunctiva pink, PERRLA and EOMs intact;  Neck:  Neck supple, trachea midline. No masses, lumps or thyromegaly present.  Cardiovascular: Normal rate and rhythm. S1,S2 noted.  No murmur, rubs or gallops noted. No JVD or BLE edema. No carotid bruits noted. Pulmonary/Chest: Normal effort and positive vesicular breath sounds. No respiratory distress. No wheezes, rales or ronchi noted.  Abdomen: Soft and nontender. Normal bowel sounds. No distention or masses noted. Liver, spleen and kidneys non palpable. Musculoskeletal: Strength 5/5 BUE/BLE.  No difficulty with gait.  Neurological: Alert and  oriented. Cranial nerves II-XII grossly intact. Coordination normal.  Psychiatric: Mood and affect normal. Behavior is normal. Judgment and thought content normal.     BMET    Component Value Date/Time   NA 139 12/12/2022 0841   K 4.6 12/12/2022 0841   CL 103 12/12/2022 0841   CO2 28 12/12/2022 0841   GLUCOSE 98 12/12/2022 0841   BUN 11 12/12/2022 0841   CREATININE 0.75 12/12/2022 0841   CALCIUM 9.7 12/12/2022 0841   GFRNONAA 78 11/04/2019 0908   GFRAA 90 11/04/2019 0908    Lipid Panel     Component Value Date/Time   CHOL 197 12/12/2022 0841   TRIG 69 12/12/2022 0841   HDL 70 12/12/2022 0841   CHOLHDL 2.8 12/12/2022 0841   VLDL 20.8 10/17/2015 1412   LDLCALC 111 (H) 12/12/2022 0841    CBC    Component Value Date/Time   WBC 4.1 12/12/2022 0841   RBC 4.69 12/12/2022 0841   HGB 14.1 12/12/2022 0841   HCT 42.8 12/12/2022 0841   PLT 277 12/12/2022 0841   MCV 91.3 12/12/2022 0841   MCH 30.1 12/12/2022 0841   MCHC 32.9 12/12/2022 0841   RDW 12.3 12/12/2022 0841   LYMPHSABS 1,786 11/04/2019 0908   EOSABS 130 11/04/2019 0908    BASOSABS 19 11/04/2019 0908    Hgb A1C Lab Results  Component Value Date   HGBA1C 5.1 12/12/2022           Assessment & Plan:   Preventative health maintenance:  Flu shot UTD Tetanus declined today, she will get at health work Encouraged her to get her COVID booster Shingrix UTD Pap smear UTD Mammogram has already been scheduled Bone density UTD Colon screening UTD Encouraged her to consume a balanced diet and exercise regimen Advised her to see an eye doctor and dentist annually We will check CBC, c-Met, lipid, A1c today  RTC in 6 months, follow-up chronic conditions Angeline Laura, NP

## 2023-12-18 NOTE — Patient Instructions (Signed)
 Health Maintenance for Postmenopausal Women Menopause is a normal process in which your ability to get pregnant comes to an end. This process happens slowly over many months or years, usually between the ages of 76 and 38. Menopause is complete when you have missed your menstrual period for 12 months. It is important to talk with your health care provider about some of the most common conditions that affect women after menopause (postmenopausal women). These include heart disease, cancer, and bone loss (osteoporosis). Adopting a healthy lifestyle and getting preventive care can help to promote your health and wellness. The actions you take can also lower your chances of developing some of these common conditions. What are the signs and symptoms of menopause? During menopause, you may have the following symptoms: Hot flashes. These can be moderate or severe. Night sweats. Decrease in sex drive. Mood swings. Headaches. Tiredness (fatigue). Irritability. Memory problems. Problems falling asleep or staying asleep. Talk with your health care provider about treatment options for your symptoms. Do I need hormone replacement therapy? Hormone replacement therapy is effective in treating symptoms that are caused by menopause, such as hot flashes and night sweats. Hormone replacement carries certain risks, especially as you become older. If you are thinking about using estrogen or estrogen with progestin, discuss the benefits and risks with your health care provider. How can I reduce my risk for heart disease and stroke? The risk of heart disease, heart attack, and stroke increases as you age. One of the causes may be a change in the body's hormones during menopause. This can affect how your body uses dietary fats, triglycerides, and cholesterol. Heart attack and stroke are medical emergencies. There are many things that you can do to help prevent heart disease and stroke. Watch your blood pressure High  blood pressure causes heart disease and increases the risk of stroke. This is more likely to develop in people who have high blood pressure readings or are overweight. Have your blood pressure checked: Every 3-5 years if you are 32-23 years of age. Every year if you are 31 years old or older. Eat a healthy diet  Eat a diet that includes plenty of vegetables, fruits, low-fat dairy products, and lean protein. Do not eat a lot of foods that are high in solid fats, added sugars, or sodium. Get regular exercise Get regular exercise. This is one of the most important things you can do for your health. Most adults should: Try to exercise for at least 150 minutes each week. The exercise should increase your heart rate and make you sweat (moderate-intensity exercise). Try to do strengthening exercises at least twice each week. Do these in addition to the moderate-intensity exercise. Spend less time sitting. Even light physical activity can be beneficial. Other tips Work with your health care provider to achieve or maintain a healthy weight. Do not use any products that contain nicotine or tobacco. These products include cigarettes, chewing tobacco, and vaping devices, such as e-cigarettes. If you need help quitting, ask your health care provider. Know your numbers. Ask your health care provider to check your cholesterol and your blood sugar (glucose). Continue to have your blood tested as directed by your health care provider. Do I need screening for cancer? Depending on your health history and family history, you may need to have cancer screenings at different stages of your life. This may include screening for: Breast cancer. Cervical cancer. Lung cancer. Colorectal cancer. What is my risk for osteoporosis? After menopause, you may be  at increased risk for osteoporosis. Osteoporosis is a condition in which bone destruction happens more quickly than new bone creation. To help prevent osteoporosis or  the bone fractures that can happen because of osteoporosis, you may take the following actions: If you are 24-54 years old, get at least 1,000 mg of calcium and at least 600 international units (IU) of vitamin D  per day. If you are older than age 75 but younger than age 30, get at least 1,200 mg of calcium and at least 600 international units (IU) of vitamin D  per day. If you are older than age 8, get at least 1,200 mg of calcium and at least 800 international units (IU) of vitamin D  per day. Smoking and drinking excessive alcohol increase the risk of osteoporosis. Eat foods that are rich in calcium and vitamin D , and do weight-bearing exercises several times each week as directed by your health care provider. How does menopause affect my mental health? Depression may occur at any age, but it is more common as you become older. Common symptoms of depression include: Feeling depressed. Changes in sleep patterns. Changes in appetite or eating patterns. Feeling an overall lack of motivation or enjoyment of activities that you previously enjoyed. Frequent crying spells. Talk with your health care provider if you think that you are experiencing any of these symptoms. General instructions See your health care provider for regular wellness exams and vaccines. This may include: Scheduling regular health, dental, and eye exams. Getting and maintaining your vaccines. These include: Influenza vaccine. Get this vaccine each year before the flu season begins. Pneumonia vaccine. Shingles vaccine. Tetanus, diphtheria, and pertussis (Tdap) booster vaccine. Your health care provider may also recommend other immunizations. Tell your health care provider if you have ever been abused or do not feel safe at home. Summary Menopause is a normal process in which your ability to get pregnant comes to an end. This condition causes hot flashes, night sweats, decreased interest in sex, mood swings, headaches, or lack  of sleep. Treatment for this condition may include hormone replacement therapy. Take actions to keep yourself healthy, including exercising regularly, eating a healthy diet, watching your weight, and checking your blood pressure and blood sugar levels. Get screened for cancer and depression. Make sure that you are up to date with all your vaccines. This information is not intended to replace advice given to you by your health care provider. Make sure you discuss any questions you have with your health care provider. Document Revised: 07/02/2020 Document Reviewed: 07/02/2020 Elsevier Patient Education  2024 [employer].

## 2023-12-21 ENCOUNTER — Ambulatory Visit: Payer: Self-pay | Admitting: Internal Medicine

## 2023-12-22 ENCOUNTER — Other Ambulatory Visit: Payer: Self-pay | Admitting: Internal Medicine

## 2023-12-23 ENCOUNTER — Encounter: Payer: Self-pay | Admitting: Internal Medicine

## 2023-12-24 ENCOUNTER — Other Ambulatory Visit: Payer: Self-pay

## 2023-12-24 MED ORDER — ZOLPIDEM TARTRATE 5 MG PO TABS
5.0000 mg | ORAL_TABLET | Freq: Every evening | ORAL | 0 refills | Status: DC | PRN
  Filled 2023-12-24: qty 30, 30d supply, fill #0

## 2024-01-25 ENCOUNTER — Other Ambulatory Visit: Payer: Self-pay | Admitting: Internal Medicine

## 2024-01-25 ENCOUNTER — Other Ambulatory Visit: Payer: Self-pay

## 2024-01-26 ENCOUNTER — Encounter: Payer: Self-pay | Admitting: Internal Medicine

## 2024-01-26 ENCOUNTER — Other Ambulatory Visit: Payer: Self-pay

## 2024-01-26 MED ORDER — ZOLPIDEM TARTRATE 5 MG PO TABS
5.0000 mg | ORAL_TABLET | Freq: Every evening | ORAL | 0 refills | Status: DC | PRN
  Filled 2024-01-26: qty 30, 30d supply, fill #0

## 2024-01-27 ENCOUNTER — Other Ambulatory Visit: Payer: Self-pay

## 2024-02-15 DIAGNOSIS — H11153 Pinguecula, bilateral: Secondary | ICD-10-CM | POA: Diagnosis not present

## 2024-02-15 DIAGNOSIS — Z9889 Other specified postprocedural states: Secondary | ICD-10-CM | POA: Diagnosis not present

## 2024-02-15 DIAGNOSIS — M3501 Sicca syndrome with keratoconjunctivitis: Secondary | ICD-10-CM | POA: Diagnosis not present

## 2024-02-15 DIAGNOSIS — H2513 Age-related nuclear cataract, bilateral: Secondary | ICD-10-CM | POA: Diagnosis not present

## 2024-02-25 ENCOUNTER — Other Ambulatory Visit: Payer: Self-pay | Admitting: Internal Medicine

## 2024-02-26 ENCOUNTER — Encounter: Payer: Self-pay | Admitting: Internal Medicine

## 2024-02-26 ENCOUNTER — Other Ambulatory Visit: Payer: Self-pay

## 2024-02-26 MED ORDER — ZOLPIDEM TARTRATE 5 MG PO TABS
5.0000 mg | ORAL_TABLET | Freq: Every evening | ORAL | 0 refills | Status: DC | PRN
  Filled 2024-02-26: qty 30, 30d supply, fill #0

## 2024-02-26 NOTE — Telephone Encounter (Signed)
 Requested medications are due for refill today.  yes  Requested medications are on the active medications list.  yes  Last refill. 01/26/2024 #30 0 rf  Future visit scheduled.   yes  Notes to clinic.  Refill not delegated.    Requested Prescriptions  Pending Prescriptions Disp Refills   zolpidem  (AMBIEN ) 5 MG tablet 30 tablet 0    Sig: Take 1 tablet (5 mg total) by mouth at bedtime as needed for sleep.     Not Delegated - Psychiatry:  Anxiolytics/Hypnotics Failed - 02/26/2024  1:35 PM      Failed - This refill cannot be delegated      Failed - Urine Drug Screen completed in last 360 days      Passed - Valid encounter within last 6 months    Recent Outpatient Visits           2 months ago Encounter for general adult medical examination w/o abnormal findings   [facility] [place], [place] W, NP   8 months ago Constipation due to slow transit   [facility] [place], Angeline ORN, NP       Future Appointments             In 2 weeks Jacobo, Evalene PARAS, MD [facility] - A Dept Of [place]

## 2024-03-07 ENCOUNTER — Ambulatory Visit
Admission: RE | Admit: 2024-03-07 | Discharge: 2024-03-07 | Disposition: A | Discharge status: Home / Self Care | Source: Ambulatory Visit | Attending: Oncology | Admitting: Oncology

## 2024-03-07 DIAGNOSIS — C50511 Malignant neoplasm of lower-outer quadrant of right female breast: Secondary | ICD-10-CM | POA: Insufficient documentation

## 2024-03-07 DIAGNOSIS — R928 Other abnormal and inconclusive findings on diagnostic imaging of breast: Secondary | ICD-10-CM | POA: Insufficient documentation

## 2024-03-07 DIAGNOSIS — Z1231 Encounter for screening mammogram for malignant neoplasm of breast: Secondary | ICD-10-CM | POA: Insufficient documentation

## 2024-03-09 ENCOUNTER — Other Ambulatory Visit: Payer: Self-pay | Admitting: Oncology

## 2024-03-09 DIAGNOSIS — R928 Other abnormal and inconclusive findings on diagnostic imaging of breast: Secondary | ICD-10-CM

## 2024-03-14 ENCOUNTER — Inpatient Hospital Stay: Attending: Oncology | Admitting: Oncology

## 2024-03-14 DIAGNOSIS — Z809 Family history of malignant neoplasm, unspecified: Secondary | ICD-10-CM

## 2024-03-14 DIAGNOSIS — C50511 Malignant neoplasm of lower-outer quadrant of right female breast: Secondary | ICD-10-CM | POA: Diagnosis not present

## 2024-03-14 DIAGNOSIS — Z1732 Human epidermal growth factor receptor 2 negative status: Secondary | ICD-10-CM | POA: Diagnosis not present

## 2024-03-14 DIAGNOSIS — M810 Age-related osteoporosis without current pathological fracture: Secondary | ICD-10-CM | POA: Diagnosis not present

## 2024-03-14 DIAGNOSIS — Z170 Estrogen receptor positive status [ER+]: Secondary | ICD-10-CM | POA: Diagnosis not present

## 2024-03-14 DIAGNOSIS — Z87891 Personal history of nicotine dependence: Secondary | ICD-10-CM | POA: Diagnosis not present

## 2024-03-14 DIAGNOSIS — Z801 Family history of malignant neoplasm of trachea, bronchus and lung: Secondary | ICD-10-CM | POA: Diagnosis not present

## 2024-03-14 DIAGNOSIS — Z808 Family history of malignant neoplasm of other organs or systems: Secondary | ICD-10-CM

## 2024-03-14 DIAGNOSIS — Z1721 Progesterone receptor positive status: Secondary | ICD-10-CM | POA: Diagnosis not present

## 2024-03-14 NOTE — Progress Notes (Unsigned)
 "  [facility]  [phone] [phone]  ID: Heather Jensen OB: August 16, 1960  MR#: [id number]  RDW#:[id number]  Patient Care Team: Antonette Angeline ORN, NP as PCP - General (Internal Medicine) Jacobo Evalene PARAS, MD as Consulting Physician (Oncology) Lenn Aran, MD as Consulting Physician (Radiation Oncology)  I connected with Heather Jensen on 03/14/24 at  3:30 PM EST by {Blank single:19197::video enabled telemedicine visit,telephone visit} and verified that I am speaking with the correct person using two identifiers.   I discussed the limitations, risks, security and privacy concerns of performing an evaluation and management service by telemedicine and the availability of in-person appointments. I also discussed with the patient that there may be a patient responsible charge related to this service. The patient expressed understanding and agreed to proceed.   Other persons participating in the visit and their role in the encounter: Patient, MD.  Patients location: Home. Providers location: Clinic.  CHIEF COMPLAINT: Pathologic stage Ia ER/PR positive, HER-2 negative invasive carcinoma of the lower outer quadrant of the right breast.  Oncotype score 13, low risk.  INTERVAL HISTORY: Patient agreed to video-assisted telemedicine visit for routine 10-month evaluation.  She continues to tolerate letrozole  without significant side effects.  She currently feels well and is asymptomatic.  She has no neurologic complaints.  She denies any recent fevers or illnesses.  She has a good appetite and denies weight loss.  She has no chest pain, shortness of breath, cough, or hemoptysis.  She denies any nausea, vomiting, constipation, or diarrhea.  She has no urinary complaints.  Patient offers no specific complaints today.  REVIEW OF SYSTEMS:   Review of Systems  Constitutional: Negative.  Negative for fever, malaise/fatigue and weight loss.   Respiratory: Negative.  Negative for cough, hemoptysis and shortness of breath.   Cardiovascular: Negative.  Negative for chest pain and leg swelling.  Gastrointestinal: Negative.  Negative for abdominal pain.  Genitourinary: Negative.  Negative for dysuria.  Musculoskeletal: Negative.  Negative for back pain.  Skin: Negative.  Negative for rash.  Neurological: Negative.  Negative for dizziness, focal weakness, weakness and headaches.  Psychiatric/Behavioral: Negative.  The patient is not nervous/anxious.     As per HPI. Otherwise, a complete review of systems is negative.  PAST MEDICAL HISTORY: Past Medical History:  Diagnosis Date   Adenomatous polyp 2019   Anxiety    Breast cancer (HCC) 03/22/2021   Chicken pox    Depression    Genetic testing 07/09/2021   Negative genetic testing. No pathogenic variants identified on the [facility] CancerNext-Expanded+RNA panel. The report date is 07/08/2021.     The CancerNext-Expanded + RNAinsight gene panel offered by [employer] and includes sequencing and rearrangement analysis for the following 77 genes: IP, ALK, APC*, ATM*, AXIN2, BAP1, BARD1, BLM, BMPR1A, BRCA1*, BRCA2*, BRIP1*, CDC73, CDH1*,CDK4, CDKN1B, CDKN   GERD (gastroesophageal reflux disease)    Personal history of radiation therapy     PAST SURGICAL HISTORY: Past Surgical History:  Procedure Laterality Date   BREAST BIOPSY Left 2010   biopsy of a lump   BREAST LUMPECTOMY     BREAST LUMPECTOMY WITH SENTINEL LYMPH NODE BIOPSY Right 04/10/2021   Procedure: BREAST LUMPECTOMY WITH SENTINEL LYMPH NODE BX;  Surgeon: Dessa Reyes ORN, MD;  Location: [facility] OR[facility];  Service: General;  Laterality: Right;   CESAREAN SECTION  1989   COLONOSCOPY  2019   Adenomatous Polyp: [facility] 12/2017: Recall ltr mailed 12/11/17   COLONOSCOPY WITH PROPOFOL  N/A 02/02/2023  Procedure: COLONOSCOPY WITH PROPOFOL ;  Surgeon: Jinny Carmine, MD;  Location: [facility];  Service: Endoscopy;   Laterality: N/A;   EYE SURGERY  2002   Right eye lasik   POLYPECTOMY  02/02/2023   Procedure: POLYPECTOMY;  Surgeon: Jinny Carmine, MD;  Location: [facility];  Service: Endoscopy;;   TUBAL LIGATION  2000    FAMILY HISTORY: Family History  Problem Relation Age of Onset   Hyperlipidemia Mother    Alzheimer's disease Mother    Hypertension Father    Heart disease Father    Melanoma Brother 23   Lung cancer Paternal Grandfather    Cancer Brother    Breast cancer Neg Hx    Stroke Neg Hx     ADVANCED DIRECTIVES (Y/N):  N  HEALTH MAINTENANCE: Social History   Tobacco Use   Smoking status: Former    Current packs/day: 0.00    Average packs/day: 0.5 packs/day for 30.0 years (15.0 ttl pk-yrs)    Types: Cigarettes    Start date: 04/10/1984    Quit date: 04/10/2014    Years since quitting: 9.9   Smokeless tobacco: Never  Vaping Use   Vaping status: Never Used  Substance Use Topics   Alcohol use: Not Currently    Alcohol/week: 1.0 standard drink of alcohol    Types: 1 Glasses of wine per week    Comment: on special occassions   Drug use: No     Colonoscopy:  PAP:  Bone density:  Lipid panel:  No Known Allergies  Current Outpatient Medications  Medication Sig Dispense Refill   Calcium-Magnesium-Vitamin D  (CALCIUM 1200+D3 PO) Take by mouth.     famotidine (PEPCID) 20 MG tablet Take 20 mg by mouth daily.     letrozole  (FEMARA ) 2.5 MG tablet Take 1 tablet (2.5 mg total) by mouth daily. 90 tablet 3   zolpidem  (AMBIEN ) 5 MG tablet Take 1 tablet (5 mg total) by mouth at bedtime as needed for sleep. 30 tablet 0   No current facility-administered medications for this visit.    OBJECTIVE: There were no vitals filed for this visit.    There is no height or weight on file to calculate BMI.    ECOG FS:0 - Asymptomatic  General: Well-developed, well-nourished, no acute distress. HEENT: Normocephalic. Neuro: Alert, answering all questions  appropriately. Cranial nerves grossly intact. Psych: Normal affect.  LAB RESULTS:  Lab Results  Component Value Date   NA 139 12/18/2023   K 4.2 12/18/2023   CL 101 12/18/2023   CO2 29 12/18/2023   GLUCOSE 93 12/18/2023   BUN 10 12/18/2023   CREATININE 0.82 12/18/2023   CALCIUM 9.8 12/18/2023   PROT 6.8 12/18/2023   ALBUMIN 4.3 10/17/2015   AST 15 12/18/2023   ALT 9 12/18/2023   ALKPHOS 38 (L) 10/17/2015   BILITOT 1.1 12/18/2023   GFRNONAA 78 11/04/2019   GFRAA 90 11/04/2019    Lab Results  Component Value Date   WBC 4.5 12/18/2023   NEUTROABS 3,633 11/04/2019   HGB 14.0 12/18/2023   HCT 42.8 12/18/2023   MCV 94.1 12/18/2023   PLT 270 12/18/2023     STUDIES: MM 3D SCREENING MAMMOGRAM BILATERAL BREAST Result Date: 03/09/2024 CLINICAL DATA:  Screening. EXAM: DIGITAL SCREENING BILATERAL MAMMOGRAM WITH TOMOSYNTHESIS AND CAD TECHNIQUE: Bilateral screening digital craniocaudal and mediolateral oblique mammograms were obtained. Bilateral screening digital breast tomosynthesis was performed. The images were evaluated with computer-aided detection. COMPARISON:  Previous exam(s). ACR Breast Density Category b: There are scattered  areas of fibroglandular density. FINDINGS: In the right breast, a possible asymmetry conspicuous only on the MLO view warrants further evaluation. In the left breast, no findings suspicious for malignancy. IMPRESSION: Further evaluation is suggested for a possible asymmetry in the right breast. RECOMMENDATION: Diagnostic mammogram and possibly ultrasound of the right breast. (Code:[id number]-23M) The patient will be contacted regarding the findings, and additional imaging will be scheduled. BI-RADS CATEGORY  0: Incomplete: Need additional imaging evaluation. Electronically Signed   By: Debby Satterfield M.D.   On: 03/09/2024 15:01    ASSESSMENT: Pathologic stage Ia ER/PR positive, HER-2 negative invasive carcinoma of the lower outer quadrant of the right breast.   Oncotype DX score 13, low risk.  PLAN:    Pathologic stage Ia ER/PR positive, HER-2 negative invasive carcinoma of the lower outer quadrant of the right breast: Patient underwent lumpectomy on April 10, 2021 confirming stage of disease.  Oncotype score was low risk, therefore chemotherapy was not indicated.  Patient completed XRT on June 11, 2021.  Continue letrozole  for total of 5 years completing treatment in April 2028.  Patient's most recent mammogram on March 06, 2023 was reported BI-RADS 2.  Return to clinic in 6 months with repeat mammogram and video-assisted telemedicine visit.      Osteoporosis: Patient's most recent bone mineral density on September 07, 2023 reported a maximum T-score of -2.6 in the right total hip.  This is essentially unchanged from previous.  Patient once again declined initiating Fosamax but plans to initiate calcium and vitamin D  supplementation.  No further intervention is needed.  Repeat bone mineral density in July 2026.  Follow-up as above.    I provided 20 minutes of face-to-face video visit time during this encounter which included chart review, counseling, and coordination of care as documented above.   Patient expressed understanding and was in agreement with this plan. She also understands that She can call clinic at any time with any questions, concerns, or complaints.    Cancer Staging  Carcinoma of lower outer quadrant of right breast [facility]) Staging form: Breast, AJCC 8th Edition - Clinical stage from 04/01/2021: Stage IA (cT1b, cN0, cM0, G2, ER+, PR+, HER2-) - Signed by Jacobo Evalene PARAS, MD on 04/03/2021 Stage prefix: Initial diagnosis Histologic grading system: 3 grade system   Evalene PARAS Jacobo, MD   03/14/2024 3:23 PM     "

## 2024-03-17 ENCOUNTER — Ambulatory Visit
Admission: RE | Admit: 2024-03-17 | Discharge: 2024-03-17 | Disposition: A | Discharge status: Home / Self Care | Source: Ambulatory Visit | Attending: Oncology | Admitting: Oncology

## 2024-03-17 DIAGNOSIS — R928 Other abnormal and inconclusive findings on diagnostic imaging of breast: Secondary | ICD-10-CM

## 2024-03-28 ENCOUNTER — Other Ambulatory Visit: Payer: Self-pay | Admitting: Internal Medicine

## 2024-03-30 ENCOUNTER — Other Ambulatory Visit: Payer: Self-pay

## 2024-03-30 ENCOUNTER — Other Ambulatory Visit: Payer: Self-pay | Admitting: Internal Medicine

## 2024-03-30 ENCOUNTER — Encounter: Payer: Self-pay | Admitting: Internal Medicine

## 2024-03-30 MED FILL — Zolpidem Tartrate Tab 5 MG: ORAL | 30 days supply | Qty: 30 | Fill #0 | Status: AC

## 2024-06-17 ENCOUNTER — Ambulatory Visit: Admitting: Internal Medicine

## 2024-09-20 ENCOUNTER — Inpatient Hospital Stay: Admitting: Oncology
